# Patient Record
Sex: Female | Born: 1960
Health system: Southern US, Community
[De-identification: ages and names within clinical notes are randomized; demographics above are authoritative.]

## PROBLEM LIST (undated history)

## (undated) DIAGNOSIS — F32A Depression, unspecified: Secondary | ICD-10-CM

## (undated) DIAGNOSIS — I10 Essential (primary) hypertension: Secondary | ICD-10-CM

## (undated) DIAGNOSIS — F419 Anxiety disorder, unspecified: Secondary | ICD-10-CM

## (undated) DIAGNOSIS — E7800 Pure hypercholesterolemia, unspecified: Secondary | ICD-10-CM

## (undated) DIAGNOSIS — F329 Major depressive disorder, single episode, unspecified: Secondary | ICD-10-CM

## (undated) HISTORY — DX: Major depressive disorder, single episode, unspecified: F32.9

## (undated) HISTORY — PX: HEMORROIDECTOMY: SUR656

## (undated) HISTORY — PX: ADENOIDECTOMY: SUR15

## (undated) HISTORY — PX: ANKLE CLOSED REDUCTION: SHX880

## (undated) HISTORY — PX: DILATION AND CURETTAGE OF UTERUS: SHX78

## (undated) HISTORY — DX: Depression, unspecified: F32.A

## (undated) HISTORY — PX: ABDOMINAL HYSTERECTOMY: SHX81

---

## 2001-01-18 ENCOUNTER — Inpatient Hospital Stay (HOSPITAL_COMMUNITY): Admission: RE | Admit: 2001-01-18 | Discharge: 2001-01-20 | Payer: Self-pay | Admitting: Specialist

## 2001-11-21 ENCOUNTER — Encounter: Payer: Self-pay | Admitting: Pediatrics

## 2001-11-21 ENCOUNTER — Ambulatory Visit (HOSPITAL_COMMUNITY): Admission: RE | Admit: 2001-11-21 | Discharge: 2001-11-21 | Payer: Self-pay | Admitting: Pediatrics

## 2001-12-13 ENCOUNTER — Ambulatory Visit (HOSPITAL_COMMUNITY): Admission: RE | Admit: 2001-12-13 | Discharge: 2001-12-13 | Payer: Self-pay | Admitting: Internal Medicine

## 2001-12-19 ENCOUNTER — Encounter: Payer: Self-pay | Admitting: Internal Medicine

## 2001-12-19 ENCOUNTER — Ambulatory Visit (HOSPITAL_COMMUNITY): Admission: RE | Admit: 2001-12-19 | Discharge: 2001-12-19 | Payer: Self-pay | Admitting: Internal Medicine

## 2002-04-21 ENCOUNTER — Ambulatory Visit (HOSPITAL_COMMUNITY): Admission: RE | Admit: 2002-04-21 | Discharge: 2002-04-21 | Payer: Self-pay | Admitting: Internal Medicine

## 2002-04-21 ENCOUNTER — Encounter: Payer: Self-pay | Admitting: Internal Medicine

## 2002-11-25 ENCOUNTER — Ambulatory Visit (HOSPITAL_COMMUNITY): Admission: RE | Admit: 2002-11-25 | Discharge: 2002-11-25 | Payer: Self-pay | Admitting: Internal Medicine

## 2002-11-25 ENCOUNTER — Encounter: Payer: Self-pay | Admitting: Internal Medicine

## 2002-12-23 ENCOUNTER — Ambulatory Visit (HOSPITAL_COMMUNITY): Admission: RE | Admit: 2002-12-23 | Discharge: 2002-12-23 | Payer: Self-pay | Admitting: Internal Medicine

## 2002-12-23 ENCOUNTER — Encounter: Payer: Self-pay | Admitting: Internal Medicine

## 2003-06-26 ENCOUNTER — Ambulatory Visit (HOSPITAL_COMMUNITY): Admission: RE | Admit: 2003-06-26 | Discharge: 2003-06-26 | Payer: Self-pay | Admitting: Internal Medicine

## 2008-03-03 ENCOUNTER — Ambulatory Visit: Payer: Self-pay | Admitting: Gastroenterology

## 2008-03-23 ENCOUNTER — Ambulatory Visit: Payer: Self-pay | Admitting: Internal Medicine

## 2008-03-23 ENCOUNTER — Ambulatory Visit (HOSPITAL_COMMUNITY): Admission: RE | Admit: 2008-03-23 | Discharge: 2008-03-23 | Payer: Self-pay | Admitting: Internal Medicine

## 2008-09-04 ENCOUNTER — Encounter (INDEPENDENT_AMBULATORY_CARE_PROVIDER_SITE_OTHER): Payer: Self-pay | Admitting: General Surgery

## 2008-09-04 ENCOUNTER — Ambulatory Visit (HOSPITAL_COMMUNITY): Admission: RE | Admit: 2008-09-04 | Discharge: 2008-09-04 | Payer: Self-pay | Admitting: General Surgery

## 2010-08-17 LAB — BASIC METABOLIC PANEL WITH GFR
BUN: 16 mg/dL (ref 6–23)
CO2: 29 meq/L (ref 19–32)
Calcium: 9.9 mg/dL (ref 8.4–10.5)
Chloride: 102 meq/L (ref 96–112)
Creatinine, Ser: 0.6 mg/dL (ref 0.4–1.2)
GFR calc Af Amer: >60 mL/min (ref >60)
GFR calc non Af Amer: >60 mL/min (ref >60)
Glucose, Bld: 87 mg/dL (ref 70–99)
Potassium: 4 meq/L (ref 3.5–5.1)
Sodium: 137 meq/L (ref 135–145)

## 2010-08-17 LAB — CBC
HCT: 42 % (ref 36.0–46.0)
Hemoglobin: 14.6 g/dL (ref 12.0–15.0)
MCHC: 34.7 g/dL (ref 30.0–36.0)
MCV: 86 fL (ref 78.0–100.0)
Platelets: 229 K/uL (ref 150–400)
RBC: 4.89 MIL/uL (ref 3.87–5.11)
RDW: 13.1 % (ref 11.5–15.5)
WBC: 6 K/uL (ref 4.0–10.5)

## 2010-09-20 NOTE — Op Note (Signed)
NAME:  Allison [name], Allison [name]                 ACCOUNT NO.:  [id number]   MEDICAL RECORD NO.:  [id number]          PATIENT TYPE:  AMB   LOCATION:  DAY                           FACILITY:  [facility]   PHYSICIAN:  R. Roetta Sessions, M.D. DATE OF BIRTH:  1960/12/12   DATE OF PROCEDURE:  03/23/2008  DATE OF DISCHARGE:                               OPERATIVE REPORT   PROCEDURE:  Ileocolonoscopy diagnostic.   INDICATIONS FOR PROCEDURE:  A 50 year old lady with intermittent, small-  volume, painless hematochezia in setting of constipation, positive  family history of colon cancer.  [name] has never had her lower GI  tract evaluated.  She has a significant positive family history colon  cancer and a sister was diagnosed with this at age 49.  Colonoscopy is  now being done.  Risks, benefits, alternatives, and limitations have  been reviewed, questions answered.  She is agreeable.  Please see the  documentation of the medical record.   PROCEDURE NOTE:  O2 saturation, blood pressure, pulse, and respirations  were monitored throughout the entire procedure.   CONSCIOUS SEDATION:  Versed 4 mg IV, Demerol 75 mg IV in divided doses.   INSTRUMENT:  Pentax video chip system.   FINDINGS:  Digital rectal exam revealed multiple small external  hemorrhoidal tags, otherwise negative.   ENDOSCOPIC FINDINGS:  The prep was good.  Colon:  Colonic mucosa was  surveyed from the rectosigmoid junction through the left transverse  right colon, the appendiceal orifice, ileocecal valve, and cecum.  These  structures well seen and photographed for the record.  Terminal ileum  was intubated to 10 cm.  From this level, the scope was slowly  withdrawn.  All previously-mentioned mucosal surfaces were again seen.  The patient did have scattered left-sided diverticula; however, the  remainder of colonic mucosa and terminal ileal mucosa appeared entirely  normal.  Scope was pulled down the rectum.  A thorough examination of  the  rectal mucosa including retroflexed view of the anal verge  demonstrated no abnormalities.  The patient tolerated the procedure  well.  It was reactive endoscopy.   IMPRESSION:  1. External hemorrhoids, likely source of hematochezia, otherwise      normal rectum.  2. Left-sided diverticula, colonic mucosa, and terminal ileal mucosa      appeared normal.   RECOMMENDATIONS:  1. Constipation, diverticulosis, and hemorrhoid literature provided to      [name].  A 10-day course of Anusol HC      suppositories 1 per rectum at bedtime, daily fiber supplement      recommended.  2. MiraLax 17 g orally nightly p.r.n. constipation.  3. Given her family history of colon cancer, she ought to return in 5      years for high-risk screening colonoscopy.      Jonathon Bellows, M.D.  Electronically Signed     RMR/MEDQ  D:  03/23/2008  T:  03/24/2008  Job:  [id number]   cc:   Francoise Schaumann. Milford Cage DO, FAAP  Fax: [phone]

## 2010-09-20 NOTE — H&P (Signed)
NAME:  Allison, [name] NO.:  [id number]   MEDICAL RECORD NO.:  [id number]          PATIENT TYPE:  AMB   LOCATION:  DAY                           FACILITY:  [facility]   PHYSICIAN:  Dalia Heading, M.D.  DATE OF BIRTH:  [birth year]   DATE OF ADMISSION:  DATE OF DISCHARGE:  LH                              HISTORY & PHYSICAL   CHIEF COMPLAINT:  Hemorrhoidal disease.   HISTORY OF PRESENT ILLNESS:  The patient is a 50 year old white female  who is referred for evaluation and treatment of hemorrhoidal disease.  It has been present for many years.  She is having increasing discomfort  and occasional blood in the toilet paper when she wipes herself.  It is  difficult to keep herself clean.   PAST MEDICAL HISTORY:  Hypertension.   PAST SURGICAL HISTORY:  C-sections.   CURRENT MEDICATION:  Enalapril, citalopram, and estradiol.   ALLERGIES:  No known drug allergies.   REVIEW OF SYSTEMS:  Noncontributory.   PHYSICAL EXAMINATION:  GENERAL:  The patient is a well-developed and  well-nourished white female in no acute distress.  LUNGS:  Clear to auscultation with equal breath sounds bilaterally.  HEART:  Regular rate and rhythm without S3, S4, or murmurs.  RECTAL:  Extensive external hemorrhoidal disease with a posterior  prolapsing internal hemorrhoid.   IMPRESSION:  Hemorrhoidal disease.   PLAN:  The patient is scheduled for an extensive hemorrhoidectomy on  September 04, 2008.  The risks and benefits of the procedure including  bleeding, infection, and recurrence of the hemorrhoids were fully  explained to the patient, gave informed consent.      Dalia Heading, M.D.  Electronically Signed     MAJ/MEDQ  D:  08/13/2008  T:  08/14/2008  Job:  [id number]   cc:   Tilda Burrow, M.D.  Fax: [phone]   Francoise Schaumann. Raynelle Highland  Fax: [phone]   Short Stay, [facility]

## 2010-09-20 NOTE — Consult Note (Signed)
NAME:  Allison [name], Allison [name]                 ACCOUNT NO.:  [id number]   MEDICAL RECORD NO.:  [id number]          PATIENT TYPE:  AMB   LOCATION:  DAY                           FACILITY:  [facility]   PHYSICIAN:  R. Roetta Sessions, M.D. DATE OF BIRTH:  05-19-1960   DATE OF CONSULTATION:  03/03/2008  DATE OF DISCHARGE:                                 CONSULTATION   REASON FOR CONSULTATION:  Constipation, blood in the stool, and family  history of colon cancer, need colonoscopy.   PHYSICIAN REQUESTING CONSULTATION:  Francoise Schaumann. Halm, DO, FAAP   HISTORY OF PRESENT ILLNESS:  Allison [name] is a very pleasant 50 year old  lady who presents today for possible colonoscopy.  Allison [name] never had  colonoscopy done.  Her sister was diagnosed with colon cancer at age 71.  The patient has history of chronic constipation.  Generally, Allison [name] has a  bowel movement every day.  Allison [name] generally does not take anything for  bowel movements.  When her stools are hard, Allison [name] may have some bright red  blood per rectum.  Allison [name] states Allison [name] has hemorrhoids.  Allison [name] has been trying  to lose some weight, has lost 8 pounds.  Allison [name] denies any abdominal pain,  nausea or vomiting, heartburn, dysphagia, or odynophagia.   CURRENT MEDICATIONS:  1. Enalapril/hydrochlorothiazide 5/12.5 mg daily.  2. Citalopram 20 mg daily.  3. HRT daily.  4. Xanax p.r.n.  5. Calcium 500 mg daily.   ALLERGIES:  No known drug allergies.   PAST MEDICAL HISTORY:  Hypertension, anxiety, hypercholesterolemia,  trying diet therapy.  Prior EGD in August 2003 by Dr. Jena Gauss was normal.  Allison [name] had cesarean sections in 1983 and 1986.  Allison [name] had her tubes tied in  1987.  Allison [name] had another cesarean section in 1995.  Hysterectomy with  single ovary removed in 2000.  In 2001, Allison [name] had the other ovary removed.  Allison [name] had D&C in 2000.  Allison [name] has history of hepatic hemangioma.   FAMILY HISTORY:  Mother is 85, has history of Alzheimer's.  Father died  at age 71 with MI.  Allison [name] has a sister diagnosed with  colon cancer at age  65.  Allison [name] has a brother with diabetes.   SOCIAL HISTORY:  Allison [name] has been married for 29 years.  Allison [name] has 3 children.  Allison [name] works with [employer].  Allison [name] is a nonsmoker.  No  alcohol use.   REVIEW OF SYSTEMS:  GI:  See HPI.  CONSTITUTIONAL:  See HPI.  CARDIOPULMONARY:  No chest pain, shortness of breath, palpitations, or  cough.  GENITOURINARY:  No dysuria or hematuria.   PHYSICAL EXAMINATION:  VITAL SIGNS:  Weight 159, height 5 feet 1 inch,  temperature 97.9, blood pressure 130/90, and pulse 60.  GENERAL:  Pleasant, well-nourished, well-developed, Caucasian female in  no acute distress.  SKIN:  Warm and dry.  No jaundice.  HEENT:  Sclerae nonicteric. Oropharyngeal mucosa moist and pink.  No  lesions, erythema, or exudate.  No lymphadenopathy or thyromegaly.  CHEST:  Lungs are clear to auscultation.  CARDIAC:  Regular rate and rhythm.  Normal S1 and S2.  No murmurs, rubs,  or gallops.  ABDOMEN:  Positive bowel sounds.  Abdomen is soft, nontender, and  nondistended.  No organomegaly or masses.  No rebound or guarding.  No  abdominal bruits or hernias.  LOWER EXTREMITIES:  No edema.   IMPRESSION:  Allison [name] is a 49 year old lady with a family history of  colorectal cancer in her sister at age 6.  Allison [name] has chronic constipation  with intermittent hematochezia, which Allison [name] believes is related to her  hemorrhoids.  Allison [name] never had a colonoscopy.  We will recommend diagnostic  colonoscopy as well as high-risk screen, given family history of colon  cancer.   PLAN:  1. Colonoscopy with Dr. Jena Gauss in the near future.  I have discussed      risks, alternatives, and benefits with regards to, but not limited      to the risk reaction of medication, bleeding, infection,      perforation, and Allison [name] is agreeable to proceed.  2. Allison [name] may take MiraLax 17 g daily as needed for constipation.  3. Further recommendations to follow.      Tana Coast, P.AJonathon Bellows, M.D.  Electronically Signed    LL/MEDQ  D:  03/03/2008  T:  03/03/2008  Job:  [id number]   cc:   Francoise Schaumann. Milford Cage DO, FAAP  Fax: [phone]

## 2010-09-20 NOTE — Op Note (Signed)
NAME:  Allison [name], Allison [name] NO.:  [id number]   MEDICAL RECORD NO.:  [id number]          PATIENT TYPE:  AMB   LOCATION:  DAY                           FACILITY:  [facility]   PHYSICIAN:  Dalia Heading, M.D.  DATE OF BIRTH:  05/02/1961   DATE OF PROCEDURE:  09/04/2008  DATE OF DISCHARGE:                               OPERATIVE REPORT   PREOPERATIVE DIAGNOSIS:  Hemorrhoidal disease, bleeding.   POSTOPERATIVE DIAGNOSIS:  Hemorrhoidal disease, bleeding.   PROCEDURE:  Extensive hemorrhoidectomy.   SURGEON:  Dalia Heading, MD   ANESTHESIA:  General.   INDICATIONS:  The patient is a 50 year old white female who is referred  for evaluation and treatment of hemorrhoidal disease.  The risks and  benefits of the procedure including bleeding, infection, and recurrence  of the hemorrhoidal disease were fully explained to the patient, gave  informed consent.   PROCEDURE NOTE:  The patient was placed in the lithotomy position after  general anesthesia was administered.  The perineum was prepped and  draped using the usual sterile technique with Betadine.  Surgical site  confirmation was performed.   The patient had significant internal and external hemorrhoidal disease  circumferentially.  The external hemorrhoidal disease was most  pronounced along the right side of the anus.  Multiple external  hemorrhoidal skin as well as internal hemorrhoids were excised using the  LigaSure.  Care was taken to avoid the external sphincter mechanism.  Sensorcaine 0.5% was instilled into the surrounding perineum.  Surgicel  and viscous Xylocaine packing was then placed.   All tape and needle counts were correct at the end of the procedure.  The patient was awakened and transferred to PACU in stable condition.   COMPLICATIONS:  None.   SPECIMEN:  Hemorrhoids.   ESTIMATED BLOOD LOSS:  Minimal.      Dalia Heading, M.D.  Electronically Signed     MAJ/MEDQ  D:  09/04/2008  T:   09/04/2008  Job:  [id number]   cc:   Tilda Burrow, M.D.  Fax: [phone]   Francoise Schaumann. Milford Cage DO, FAAP  Fax: [phone]

## 2010-09-23 NOTE — Consult Note (Signed)
NAME:  Allison [name], Allison [name]                           ACCOUNT NO.:  [id number]   MEDICAL RECORD NO.:  [id number]                   PATIENT TYPE:  AMB   LOCATION:  DAY                                  FACILITY:  [facility]   PHYSICIAN:  Gerrit Friends. Rourk, M.D.               DATE OF BIRTH:  [birth year]   DATE OF CONSULTATION:  DATE OF DISCHARGE:                           GASTROENTEROLOGY CONSULTATION   REASON FOR CONSULTATION:  Epigastric pain and indigestion.   HISTORY OF PRESENT ILLNESS:  The patient is a pleasant 50 year-old lady sent  over at the request of Dr. Acey Lav to further evaluate an approximately  six week history of epigastric lower retrosternal chest discomfort. She  tells me the symptoms started acutely one day when she stretched.  She  tells me ever since that she has had intermittent epigastric and lower  retrosternal chest discomfort oftentimes after she eats.  She was started on  Nexium 40 mg early daily and Reglan 5 mg early b.i.d.  Several weeks ago she  had seen Dr. Milford Cage multiple times.  She feels that her symptoms are slowly  improving but are still present and she reports multiple episodes weekly.  Again, food tends to incite symptoms.  They last for a few hours and  subside. She has not had any nausea or vomiting.  No odynophagia or  dysphagia.  She is really not having much in the way typical symptoms.  She  describes lower retrosternal chest discomfort and epigastric discomfort as a  boring pain. It does not radiate.  An ultrasound recently demonstrated a  normal gallbladder and multiple lesions in the liver consistent with hepatic  cysts.  It was felt that a followup examination in an interval time was  indicated.  She has not had any change in her bowel habits.  No melena or  rectal bleeding. No constipation or diarrhea.  She has not had any change in  weight.  She does use Aleve rarely for various aches and pains.   PAST MEDICAL HISTORY:  No chronic illnesses such  as diabetes, heart or lung  problems.   PAST SURGICAL HISTORY:  1. Cesarean section times three.  2. Tubal ligation.  3. Partial followed by a complete hysterectomy (Dr. Kate Sable).   CURRENT MEDICATIONS:  1. Nexium 40 mg early daily.  2. Reglan 5 mg early b.i.d.   ALLERGIES:  No known drug allergies.   FAMILY HISTORY:  Negative for chronic GI or liver disease.  Father died of  an MI.  Mother is alive and in good health.   SOCIAL HISTORY:  The patient has been married for 21 years and has three  children.  She is employed part-time with [employer] here in  [place].  No tobacco or alcohol.   REVIEW OF SYMPTOMS:  CARDIAC:  No chest pain or dyspnea.  CONSTITUTIONAL:  No fever or chills.  PHYSICAL EXAMINATION:  GENERAL:  This reveals a pleasant 50 year-old lady resting comfortably.   VITAL SIGNS:  Weight 147, height 5'1, temperature 98.8, blood pressure  110/82, pulse 78.   SKIN:  Warm and dry.  No jaundice.  No stigmata of chronic liver disease.   HEENT:  No scleral icterus.  Conjunctivae are pink.  Oral cavity:  No  lesions. Jugular venous distention is not prominent.   CHEST:  Lungs are clear to auscultation.   CARDIAC:  Regular rate and rhythm without murmur, gallop or rub.   BREAST:  Deferred.   ABDOMEN:  Somewhat obese, positive bowel sounds, soft with some epigastric  tenderness to palpation.  No appreciable mass or organomegaly.   EXTREMITIES:  No cyanosis, clubbing or edema.   IMPRESSION:  The patient is a pleasant 50 year-old lady with a six week  history of intermittent epigastric and lower retrosternal chest discomfort.  She does have a postprandial component.  Ultrasound demonstrated no evidence  of gallbladder disease.  Symptoms have improved, but have been somewhat slow  to respond at least temporarily related to Nexium and Reglan.  Symptoms  would be somewhat atypical for peptic ulcer disease.  Musculoskeletal strain  related to  stretching also remains in the differential, but it would be  hard to explain postprandial exacerbation of musculoskeletal pain.  Hepatic  cysts most likely have nothing to do with her presentation, but I agree with  Dr. Purcell Mouton that she ought to have a followup ultrasound.   RECOMMENDATIONS:  1. I have offered the patient an upper gastrointestinal endoscopy to further     evaluate her symptoms.  The potential risks, benefits and alternatives     have been explained and her questions have been answered.  The patient is     at very low risk for conscious sedation using Versed and Demerol.  We     will also go ahead and check a liver profile, amylase and lipase today.  2. I have recommended that she come back in three months for a followup     hepatic ultrasound.  3. She may ultimately need a HIDA scan, but we will decide that based on     endoscopic findings, results of laboratory work and how she does over the     next one to two weeks.   I would like to thank Dr. Acey Lav for allowing me to see this nice lady  today.  Further recommendations to follow.                                               Gerrit Friends. Rourk, M.D.    RMR/MEDQ  D:  12/10/2001  T:  12/10/2001  Job:  [id number]

## 2010-09-23 NOTE — Op Note (Signed)
   NAME:  Allison [name], Allison [name]                           ACCOUNT NO.:  [id number]   MEDICAL RECORD NO.:  [id number]                   PATIENT TYPE:  AMB   LOCATION:  DAY                                  FACILITY:  [facility]   PHYSICIAN:  Gerrit Friends. Rourk, M.D.               DATE OF BIRTH:  1960-06-18   DATE OF PROCEDURE:  DATE OF DISCHARGE:  12/13/2001                                 OPERATIVE REPORT   PROCEDURE:  Diagnostic esophagogastroduodenoscopy.   INDICATIONS FOR PROCEDURE:  The patient is a 50 year old lady with recent  epigastric and lower external chest pain. EGD is now being done to further  evaluate her symptoms. She has responded somewhat to acid suppressive  therapy and prokinetic therapy. EGD is now being done to further evaluate  her symptoms. This lady underwent an ultrasound of the abdomen on November 21, 2001. She was found to have multiple echogenic lesions in the liver most  consistent with cysts. The gallbladder appeared normal. There was no biliary  dilation. EGD has been discussed with [name]. The potential risks,  benefits, and alternatives have been reviewed, questions answered. She is  agreeable. Please see my dictated consultation note for more information.   MONITORING:  O2 saturation, blood pressure, pulse, and respirations were  monitored throughout the entire procedure.   CONSCIOUS SEDATION:  Versed 3 mg IV, Demerol 50 mg IV in divided doses.   INSTRUMENTS:  Olympus video chip adult gastroscope.   FINDINGS:  Examination of the tubular esophagus revealed no mucosal  abnormalities. The EG junction was easily traversed.   STOMACH:  The gastric cavity was empty and insufflated well with air.  Through examination of the gastric mucosa including a retroflexed view of  the proximal stomach and esophagogastric junction demonstrated no  abnormality. The pylorus was patent and easily traversed.   DUODENUM:  The bulb and second portion appeared normal.   THERAPEUTIC/DIAGNOSTIC MANEUVERS:  None.   The patient tolerated the procedure well and was reacted in endoscopy.   IMPRESSION:  Normal esophagus, normal stomach. Normal D1, D2. No endoscopic  explanation for the patient's symptoms. I doubt the lesions seen in the  liver have anything to do with her symptoms. Of note, her liver profile,  amylase and lipase were all normal through my office on December 10, 2001.    RECOMMENDATIONS:  1. Will proceed with a HIDA with a fatty meal challenge.  2. Further recommendations to follow.                                               Gerrit Friends. Rourk, M.D.    RMR/MEDQ  D:  12/13/2001  T:  12/17/2001  Job:  [id number]   cc:   Francoise Schaumann. Halm, D.O.

## 2010-09-23 NOTE — Discharge Summary (Signed)
[facility]  Patient:    Allison [name], MAUND Visit Number: [id number] MRN: [id number]          Service Type: Attending:  Belia Heman. Kate Sable, M.D. Dictated by:   Belia Heman. Kate Sable, M.D. Adm. Date:  01/17/01 Disc. Date: 01/20/01   CC:         Barbaraann Barthel, M.D.   Discharge Summary  HISTORY:  This 50 year old married white female gravida 3, para was admitted for surgery for a left pelvic mass.  The details of the patients history and physical examination are documented in the typed admission note.  OPERATIVE PROCEDURES:  January 17, 2001:  Exploratory laparotomy, lysis of extensive adhesions, excision of left pelvic mass, incidental appendectomy. At surgery exceedingly dense adhesions were noted which obscured a cystic left pelvic mass which was intimately involved with the large bowel.  Dr. Barbaraann Barthel was asked to step into the operating room for a surgical consultation and with his help the adhesions were lysed and the mass isolated.  The mass was then excised and sent for frozen section which returned a benign diagnosis.  Incidental appendectomy was also carried out.  Details of the operative procedure are documented in the typed operative note of both Dr. Kate Sable and Dr. Malvin Johns.  PATHOLOGY:  Cystic mass left adnexa, benign ovarian cyst, fallopian tube segment, left ovarian tissue, benign fragments of fallopian tube, and evidence of salpingitis isthmica nodosa, appendix, incidental appendectomy.  HOSPITAL COURSE:  The patients postoperative course was afebrile and uneventful.  The patient had normal resumption of bowel and bladder function and showed evidence of good wound healing.  First postoperative day hematocrit was 36%.  By the second postoperative day the patient was tolerating a regular diet and passing flatus.  Patient was ambulating well and appeared comfortable.  Skin incision appeared clean and dry.  The abdomen was soft  and nontender with no evidence of distention.  The patient was discharged home in satisfactory condition with postoperative instructions.  FINAL DIAGNOSES:  Left ovarian cyst with dense pelvic adhesions, chronic salpingo-oophoritis.  DISPOSITION:  Discharged home.  Office followup for skin staple removal and postoperative care.  Lortab 5/500 #15. Dictated by:   Belia Heman. Kate Sable, M.D. Attending:  Belia Heman. Kate Sable, M.D. DD:  02/21/01 TD:  02/22/01 Job: [id number]

## 2010-12-07 ENCOUNTER — Other Ambulatory Visit: Payer: Self-pay | Admitting: Obstetrics and Gynecology

## 2010-12-07 ENCOUNTER — Other Ambulatory Visit (HOSPITAL_COMMUNITY)
Admission: RE | Admit: 2010-12-07 | Discharge: 2010-12-07 | Disposition: A | Discharge status: Home / Self Care | Payer: BC Managed Care – PPO | Source: Ambulatory Visit | Attending: Obstetrics and Gynecology | Admitting: Obstetrics and Gynecology

## 2010-12-07 DIAGNOSIS — Z01419 Encounter for gynecological examination (general) (routine) without abnormal findings: Secondary | ICD-10-CM | POA: Insufficient documentation

## 2011-07-05 ENCOUNTER — Other Ambulatory Visit: Payer: Self-pay | Admitting: Obstetrics and Gynecology

## 2012-07-24 ENCOUNTER — Other Ambulatory Visit: Payer: Self-pay | Admitting: Obstetrics and Gynecology

## 2012-07-24 MED ORDER — ESTRADIOL 2 MG PO TABS: 2.0000 mg | ORAL_TABLET | Freq: Every day | ORAL | Status: DC

## 2013-03-07 ENCOUNTER — Encounter: Payer: Self-pay | Admitting: Internal Medicine

## 2013-03-25 ENCOUNTER — Telehealth: Payer: Self-pay

## 2013-03-25 ENCOUNTER — Telehealth: Payer: Self-pay | Admitting: General Practice

## 2013-03-25 ENCOUNTER — Other Ambulatory Visit: Payer: Self-pay

## 2013-03-25 DIAGNOSIS — Z1211 Encounter for screening for malignant neoplasm of colon: Secondary | ICD-10-CM

## 2013-03-25 NOTE — Telephone Encounter (Signed)
Patient received a letter to schedule her 5 year repeat tcs.  She would like for you to call her after 3pm.  Routing to [facility] for triage

## 2013-03-27 NOTE — Telephone Encounter (Signed)
Gastroenterology Pre-Procedure Review  Request Date:03/25/2013 Requesting Physician: Pt received a letter that it was time for her next colonoscopy Last one 03/23/2008 by RMR/  repeat in 5 years/ FH of CC  ( sister diagnosed in early 44"s)  PATIENT REVIEW QUESTIONS: The patient responded to the following health history questions as indicated:    1. Diabetes Melitis: no 2. Joint replacements in the past 12 months: no 3. Major health problems in the past 3 months: no 4. Has an artificial valve or MVP: no 5. Has a defibrillator: no 6. Has been advised in past to take antibiotics in advance of a procedure like teeth cleaning: no    MEDICATIONS & ALLERGIES:    Patient reports the following regarding taking any blood thinners:   Plavix? no Aspirin? no Coumadin? no  Patient confirms/reports the following medications:  Current Outpatient Prescriptions  Medication Sig Dispense Refill  . ALPRAZolam (XANAX) 1 MG tablet Take 1 mg by mouth at bedtime as needed for anxiety. Usually takes 1/2 tablet as needed      . citalopram (CELEXA) 20 MG tablet Take 20 mg by mouth daily.      . Enalapril-Hydrochlorothiazide 5-12.5 MG per tablet Take 1 tablet by mouth daily.      Marland Kitchen estradiol (ESTRACE) 2 MG tablet Take 1 tablet (2 mg total) by mouth daily.  90 tablet  2   No current facility-administered medications for this visit.    Patient confirms/reports the following allergies:  No Known Allergies  No orders of the defined types were placed in this encounter.    AUTHORIZATION INFORMATION Primary Insurance:   ID #:   Group #:  Pre-Cert / Auth required:  Pre-Cert / Auth #:   Secondary Insurance:   ID #:   Group #:  Pre-Cert / Auth required:  Pre-Cert / Auth #:   SCHEDULE INFORMATION: Procedure has been scheduled as follows:  Date: 04/14/2013             Time: 2:15 PM  Location: [facility] Short Stay  This Gastroenterology Pre-Precedure Review Form is being routed to the following  provider(s): R. Roetta Sessions, MD

## 2013-03-27 NOTE — Telephone Encounter (Signed)
Ok to schedule.

## 2013-03-27 NOTE — Telephone Encounter (Signed)
See separate triage.  

## 2013-04-01 MED ORDER — PEG-KCL-NACL-NASULF-NA ASC-C 100 G PO SOLR: 1.0000 | ORAL | Status: DC

## 2013-04-01 NOTE — Telephone Encounter (Signed)
Rx sent to the pharmacy and instructions mailed to pt.  

## 2013-04-02 ENCOUNTER — Encounter (HOSPITAL_COMMUNITY): Payer: Self-pay

## 2013-04-14 ENCOUNTER — Encounter (HOSPITAL_COMMUNITY)
Admission: RE | Disposition: A | Discharge status: Home / Self Care | Payer: Self-pay | Source: Ambulatory Visit | Attending: Internal Medicine

## 2013-04-14 ENCOUNTER — Encounter (HOSPITAL_COMMUNITY): Payer: Self-pay | Admitting: *Deleted

## 2013-04-14 ENCOUNTER — Ambulatory Visit (HOSPITAL_COMMUNITY)
Admission: RE | Admit: 2013-04-14 | Discharge: 2013-04-14 | Disposition: A | Discharge status: Home / Self Care | Payer: BC Managed Care – PPO | Source: Ambulatory Visit | Attending: Internal Medicine | Admitting: Internal Medicine

## 2013-04-14 DIAGNOSIS — Z1211 Encounter for screening for malignant neoplasm of colon: Secondary | ICD-10-CM | POA: Insufficient documentation

## 2013-04-14 DIAGNOSIS — I10 Essential (primary) hypertension: Secondary | ICD-10-CM | POA: Insufficient documentation

## 2013-04-14 DIAGNOSIS — K5730 Diverticulosis of large intestine without perforation or abscess without bleeding: Secondary | ICD-10-CM

## 2013-04-14 DIAGNOSIS — Z800 Family history of malignant neoplasm of digestive organs: Secondary | ICD-10-CM | POA: Insufficient documentation

## 2013-04-14 HISTORY — DX: Essential (primary) hypertension: I10

## 2013-04-14 HISTORY — DX: Pure hypercholesterolemia, unspecified: E78.00

## 2013-04-14 HISTORY — PX: COLONOSCOPY: SHX5424

## 2013-04-14 SURGERY — COLONOSCOPY
Anesthesia: Moderate Sedation

## 2013-04-14 MED ORDER — MEPERIDINE HCL 100 MG/ML IJ SOLN
INTRAMUSCULAR | Status: DC | PRN
  Administered 2013-04-14: 25 mg via INTRAVENOUS
  Administered 2013-04-14: 50 mg via INTRAVENOUS
  Administered 2013-04-14: 25 mg via INTRAVENOUS

## 2013-04-14 MED ORDER — MIDAZOLAM HCL 5 MG/5ML IJ SOLN
INTRAMUSCULAR | Status: AC
  Filled 2013-04-14: qty 10

## 2013-04-14 MED ORDER — ONDANSETRON HCL 4 MG/2ML IJ SOLN
INTRAMUSCULAR | Status: DC | PRN
  Administered 2013-04-14: 4 mg via INTRAVENOUS

## 2013-04-14 MED ORDER — MIDAZOLAM HCL 5 MG/5ML IJ SOLN
INTRAMUSCULAR | Status: DC | PRN
  Administered 2013-04-14: 1 mg via INTRAVENOUS
  Administered 2013-04-14 (×2): 2 mg via INTRAVENOUS
  Administered 2013-04-14: 1 mg via INTRAVENOUS

## 2013-04-14 MED ORDER — SIMETHICONE 40 MG/0.6ML PO SUSP
ORAL | Status: DC | PRN
  Administered 2013-04-14: 11:00:00

## 2013-04-14 MED ORDER — SODIUM CHLORIDE 0.9 % IV SOLN
INTRAVENOUS | Status: DC
  Administered 2013-04-14: 11:00:00 via INTRAVENOUS

## 2013-04-14 MED ORDER — ONDANSETRON HCL 4 MG/2ML IJ SOLN
INTRAMUSCULAR | Status: AC
  Filled 2013-04-14: qty 2

## 2013-04-14 MED ORDER — MEPERIDINE HCL 100 MG/ML IJ SOLN
INTRAMUSCULAR | Status: AC
  Filled 2013-04-14: qty 2

## 2013-04-14 NOTE — H&P (Signed)
  Primary Care Physician:  Catalina Pizza, MD Primary Gastroenterologist:  Dr. Jena Gauss  Pre-Procedure History & Physical: HPI:  Allison [name] is a 52 y.o. female is here for a screening colonoscopy. High risk screening examination. Sister with colon cancer at relatively young age. Patient currently has no GI symptoms. Negative colonoscopy 5 years ago.  Past Medical History  Diagnosis Date  . Hypertension   . Hypercholesteremia     Past Surgical History  Procedure Laterality Date  . Abdominal hysterectomy    . Dilation and curettage of uterus    . Cesarean section      X 3  . Adenoidectomy      Prior to Admission medications   Medication Sig Start Date End Date Taking? Authorizing Provider  citalopram (CELEXA) 20 MG tablet Take 20 mg by mouth daily.   Yes Historical Provider, MD  Enalapril-Hydrochlorothiazide 5-12.5 MG per tablet Take 1 tablet by mouth daily.   Yes Historical Provider, MD  estradiol (ESTRACE) 2 MG tablet Take 1 tablet (2 mg total) by mouth daily. 07/24/12  Yes Tilda Burrow, MD  ALPRAZolam Prudy Feeler) 1 MG tablet Take 1 mg by mouth daily as needed for anxiety.     Historical Provider, MD    Allergies as of 03/25/2013  . (Not on File)    Family History  Problem Relation Age of Onset  . Colon cancer Sister     History   Social History  . Marital Status: Married    Spouse Name: N/A    Number of Children: N/A  . Years of Education: N/A   Occupational History  . Not on file.   Social History Main Topics  . Smoking status: Never Smoker   . Smokeless tobacco: Not on file  . Alcohol Use: No  . Drug Use: No  . Sexual Activity: Not on file   Other Topics Concern  . Not on file   Social History Narrative  . No narrative on file    Review of Systems: See HPI, otherwise negative ROS  Physical Exam: BP 128/71  Pulse 61  Temp(Src) 98.1 F (36.7 C) (Oral)  Resp 18  Ht 5\' 1"  (1.549 m)  Wt 177 lb (80.287 kg)  BMI 33.46 kg/m2  SpO2 97% General:   Alert,   Well-developed, well-nourished, pleasant and cooperative in NAD Head:  Normocephalic and atraumatic. Eyes:  Sclera clear, no icterus.   Conjunctiva pink. Ears:  Normal auditory acuity. Nose:  No deformity, discharge,  or lesions. Mouth:  No deformity or lesions, dentition normal. Neck:  Supple; no masses or thyromegaly. Lungs:  Clear throughout to auscultation.   No wheezes, crackles, or rhonchi. No acute distress. Heart:  Regular rate and rhythm; no murmurs, clicks, rubs,  or gallops. Abdomen:  Soft, nontender and nondistended. No masses, hepatosplenomegaly or hernias noted. Normal bowel sounds, without guarding, and without rebound.   Msk:  Symmetrical without gross deformities. Normal posture. Pulses:  Normal pulses noted. Extremities:  Without clubbing or edema. Neurologic:  Alert and  oriented x4;  grossly normal neurologically. Skin:  Intact without significant lesions or rashes. Cervical Nodes:  No significant cervical adenopathy. Psych:  Alert and cooperative. Normal mood and affect.  Impression/Plan: Allison [name] is now here to undergo a screening colonoscopy. High risk examination.  Risks, benefits, limitations, imponderables and alternatives regarding colonoscopy have been reviewed with the patient. Questions have been answered. All parties agreeable.

## 2013-04-14 NOTE — Op Note (Signed)
[facility] [address] [place], [address]   COLONOSCOPY PROCEDURE REPORT  PATIENT: Allison, [name]  MR#:         [id number] BIRTHDATE: Dec 28, 1960 , 52  yrs. old GENDER: Female ENDOSCOPIST: R.  Roetta Sessions, MD FACP FACG REFERRED BY:  Catalina Pizza, M.D. PROCEDURE DATE:  04/14/2013 PROCEDURE:     Screening ileo-colonoscopy  INDICATIONS: High-risk screening examination- sister with colorectal cancer at a young age.  INFORMED CONSENT:  The risks, benefits, alternatives and imponderables including but not limited to bleeding, perforation as well as the possibility of a missed lesion have been reviewed.  The potential for biopsy, lesion removal, etc. have also been discussed.  Questions have been answered.  All parties agreeable. Please see the history and physical in the medical record for more information.  MEDICATIONS: Versed 6 mg IV and Demerol 100 mg IV in divided doses. Zofran 4 mg IV.  DESCRIPTION OF PROCEDURE:  After a digital rectal exam was performed, the EC-[id number] ([id number])  colonoscope was advanced from the anus through the rectum and colon to the area of the cecum, ileocecal valve and appendiceal orifice.  The cecum was deeply intubated.  These structures were well-seen and photographed for the record.  From the level of the cecum and ileocecal valve, the scope was slowly and cautiously withdrawn.  The mucosal surfaces were carefully surveyed utilizing scope tip deflection to facilitate fold flattening as needed.  The scope was pulled down into the rectum where a thorough examination including retroflexion was performed.    FINDINGS:  Adequate preparation Normal rectum. Few, scattered sigmoid diverticula; the remainder of the colonic mucosa appeared normal. The distal 10 cm of terminal ileal mucosa also appeared normal.  THERAPEUTIC / DIAGNOSTIC MANEUVERS PERFORMED:  None  COMPLICATIONS: None  CECAL WITHDRAWAL TIME:  8 minutes  IMPRESSION:   Colonic diverticulosis  RECOMMENDATIONS: Repeat colonoscopy in 5 years for screening purposes.   _______________________________ eSigned:  R. Roetta Sessions, MD FACP [facility] 04/14/2013 11:45 AM   CC:    PATIENT NAME:  Allison, [name] MR#: [id number]

## 2013-04-17 ENCOUNTER — Encounter (HOSPITAL_COMMUNITY): Payer: Self-pay | Admitting: Internal Medicine

## 2013-06-02 ENCOUNTER — Other Ambulatory Visit: Payer: Self-pay | Admitting: Obstetrics and Gynecology

## 2013-07-25 ENCOUNTER — Telehealth: Payer: Self-pay

## 2013-07-25 NOTE — Telephone Encounter (Signed)
Pt states would like to discuss concerns about estradiol with Dr. Emelda FearFerguson, asked for more info but pt states would like to discuss with Dr. Emelda FearFerguson.

## 2013-08-19 NOTE — Telephone Encounter (Signed)
i left a msg for pt to let us know a callback time for later this wk.

## 2013-12-22 ENCOUNTER — Encounter: Payer: Self-pay | Admitting: Obstetrics and Gynecology

## 2014-08-10 ENCOUNTER — Other Ambulatory Visit: Payer: Self-pay | Admitting: Obstetrics and Gynecology

## 2015-03-03 ENCOUNTER — Encounter: Payer: Self-pay | Admitting: Obstetrics and Gynecology

## 2015-03-03 ENCOUNTER — Ambulatory Visit (INDEPENDENT_AMBULATORY_CARE_PROVIDER_SITE_OTHER): Payer: BLUE CROSS/BLUE SHIELD | Admitting: Obstetrics and Gynecology

## 2015-03-03 VITALS — BP 138/92 | HR 60 | Ht 61.0 in | Wt 181.6 lb

## 2015-03-03 DIAGNOSIS — Z01419 Encounter for gynecological examination (general) (routine) without abnormal findings: Secondary | ICD-10-CM | POA: Diagnosis not present

## 2015-03-03 DIAGNOSIS — Z0000 Encounter for general adult medical examination without abnormal findings: Secondary | ICD-10-CM

## 2015-03-03 NOTE — Progress Notes (Signed)
Patient ID: Allison [name], female   DOB: [birth year], 53 y.o.   MRN: [id number]  Assessment:  Annual Gyn Exam   Plan:  1.   Annual exam for support useful every three years   2.   Annual mammogram advised 3.   Dr. Margo Aye give hemoccult cards during years with no gynecological visit.  4.   Refill Estrace  Subjective:  Allison [name] is a 54 y.o. female No obstetric history on file. who presents for annual exam. No LMP recorded. Patient has had a hysterectomy. The patient reports she has no complaints today. Pt reports her last pap smear was 4 years ago. Pt reports she had labs including lipid panel completed last week at her PCPs direction.   The following portions of the patient's history were reviewed and updated as appropriate: allergies, current medications, past family history, past medical history, past social history, past surgical history and problem list. Past Medical History  Diagnosis Date  . Hypertension   . Hypercholesteremia    Past Surgical History  Procedure Laterality Date  . Abdominal hysterectomy    . Dilation and curettage of uterus    . Cesarean section      X 3  . Adenoidectomy    . Colonoscopy N/A 04/14/2013    Procedure: COLONOSCOPY;  Surgeon: Corbin Ade, MD;  [facility]: [facility];  Service: Endoscopy;  Laterality: N/A;  2:15 PM-moved to 1100 [address] notified pt     Current outpatient prescriptions:  .  ALPRAZolam (XANAX) 1 MG tablet, Take 1 mg by mouth daily as needed for anxiety. , Disp: , Rfl:  .  citalopram (CELEXA) 20 MG tablet, Take 20 mg by mouth daily., Disp: , Rfl:  .  Enalapril-Hydrochlorothiazide 5-12.5 MG per tablet, Take 1 tablet by mouth daily., Disp: , Rfl:  .  estradiol (ESTRACE) 2 MG tablet, TAKE 1 TABLET (2 MG TOTAL) BY MOUTH DAILY., Disp: 90 tablet, Rfl: 13  Review of Systems Constitutional: negative Gastrointestinal: negative Genitourinary: negative   Objective:  BP 138/92 mmHg  Pulse 60  Ht  (1.549 m)  Wt 181 lb 9.6  oz (82.373 kg)  BMI 34.33 kg/m2   BMI: Body mass index is 34.33 kg/(m^2).  General Appearance: Alert, appropriate appearance for age. No acute distress HEENT: Grossly normal Neck / Thyroid:  Cardiovascular: RRR; normal S1, S2, no murmur Lungs: CTA bilaterally Back: No CVAT Breast Exam: No dimpling, nipple retraction or discharge. No masses or nodes., Normal to inspection, Normal breast tissue bilaterally and No masses or nodes.No dimpling, nipple retraction or discharge. Gastrointestinal: Soft, non-tender, no masses or organomegaly Pelvic Exam: Vulva and vagina appear normal. Bimanual exam reveals normal uterus and adnexa. External genitalia: normal general appearance Urinary system: urethral meatus normal Vaginal: normal mucosa without prolapse or lesions, normal without tenderness, induration or masses and normal rugae Cervix: removed surgically Adnexa: removed surgically and and negative Uterus: removed surgically Rectovaginal: normal rectal, no masses Lymphatic Exam: Non-palpable nodes in neck, clavicular, axillary, or inguinal regions Skin: no rash or abnormalities Neurologic: Normal gait and speech, no tremor  Psychiatric: Alert and oriented, appropriate affect.  Urinalysis:Not done  Christin Bach. MD Pgr [phone] 9:58 AM  By signing my name below, I, Marica Otter, attest that this documentation has been prepared under the direction and in the presence of Christin Bach, MD. Electronically Signed: Marica Otter, ED Scribe. 03/03/2015. 9:58 AM.  I personally performed the services described in this documentation, which was SCRIBED in my presence.  The recorded information has been reviewed and considered accurate. It has been edited as necessary during review. Tilda BurrowFERGUSON,Ascencion Stegner V, MD

## 2015-03-29 ENCOUNTER — Encounter: Payer: Self-pay | Admitting: Obstetrics and Gynecology

## 2015-09-06 ENCOUNTER — Other Ambulatory Visit: Payer: Self-pay | Admitting: Obstetrics and Gynecology

## 2015-09-08 NOTE — Telephone Encounter (Signed)
refil x 1 month, needs appt.

## 2015-12-10 ENCOUNTER — Other Ambulatory Visit: Payer: Self-pay | Admitting: Obstetrics and Gynecology

## 2015-12-20 ENCOUNTER — Other Ambulatory Visit: Payer: Self-pay | Admitting: Obstetrics and Gynecology

## 2015-12-20 MED ORDER — ESTRADIOL 2 MG PO TABS: ORAL_TABLET | ORAL | 0 refills | Status: DC

## 2016-03-20 ENCOUNTER — Other Ambulatory Visit: Payer: Self-pay | Admitting: Obstetrics and Gynecology

## 2016-09-04 ENCOUNTER — Other Ambulatory Visit: Payer: Self-pay | Admitting: Obstetrics and Gynecology

## 2016-09-05 ENCOUNTER — Telehealth: Payer: Self-pay | Admitting: *Deleted

## 2016-09-05 ENCOUNTER — Other Ambulatory Visit: Payer: Self-pay | Admitting: Obstetrics and Gynecology

## 2016-09-08 ENCOUNTER — Encounter: Payer: Self-pay | Admitting: Obstetrics and Gynecology

## 2016-09-25 ENCOUNTER — Telehealth: Payer: Self-pay | Admitting: *Deleted

## 2016-09-25 NOTE — Telephone Encounter (Signed)
LMOM that pt should be seen by Dr Emelda FearFerguson before refill since it has been over a year. Advised pt that I did see where she had one refill at [facility]. Advised her to call us back if that wasn't the case.

## 2016-10-11 ENCOUNTER — Encounter: Payer: Self-pay | Admitting: Obstetrics and Gynecology

## 2016-10-11 ENCOUNTER — Ambulatory Visit (INDEPENDENT_AMBULATORY_CARE_PROVIDER_SITE_OTHER): Payer: BLUE CROSS/BLUE SHIELD | Admitting: Obstetrics and Gynecology

## 2016-10-11 ENCOUNTER — Other Ambulatory Visit: Payer: Self-pay | Admitting: Obstetrics and Gynecology

## 2016-10-11 VITALS — BP 120/62 | HR 56 | Ht 61.0 in | Wt 167.0 lb

## 2016-10-11 DIAGNOSIS — Z90710 Acquired absence of both cervix and uterus: Secondary | ICD-10-CM

## 2016-10-11 DIAGNOSIS — E8940 Asymptomatic postprocedural ovarian failure: Secondary | ICD-10-CM | POA: Insufficient documentation

## 2016-10-11 MED ORDER — ESTRADIOL 1 MG PO TABS: 1.0000 mg | ORAL_TABLET | Freq: Every day | ORAL | 11 refills | Status: DC

## 2016-10-11 NOTE — Progress Notes (Signed)
Patient ID: Allison IshiharaVickey C Lanza, female   DOB: 1960-11-27, 56 y.o.   MRN: [id number]   [facility]  @DATE @            Patient name: Allison [name] MRN [id number]  Date of birth: 1960-11-27  CC & HPI:   Chief Complaint  Patient presents with  . Follow-up    needing refill of Estradiol     Allison [name] is a 56 y.o. female presenting today for refill of 2 mg Estradiol, which she states controls her vasomotor symptoms.  Pt did well with less vasomotor sx when she missed pills recently. Pt advised to reduce Estrogen dosing level , as few need 2 mg.d  ROS:  ROS No complaints , Was placed on 2mg  at time of surgical oophorectojmy  Pertinent History Reviewed:   Reviewed: Significant for abdominal hysterectomy, c/s x3, D&C, negative hx for clots, cardiac probs, DVT, PE,  Medical         Past Medical History:  Diagnosis Date  . Hypercholesteremia   . Hypertension                               Surgical Hx:    Past Surgical History:  Procedure Laterality Date  . ABDOMINAL HYSTERECTOMY    . ADENOIDECTOMY    . CESAREAN SECTION     X 3  . COLONOSCOPY N/A 04/14/2013   Procedure: COLONOSCOPY;  Surgeon: Corbin Adeobert M Rourk, MD;  Location: [facility];  Service: Endoscopy;  Laterality: N/A;  2:15 PM-moved to 1100 [address] notified pt  . DILATION AND CURETTAGE OF UTERUS     Medications: Reviewed & Updated - see associated section                       Current Outpatient Prescriptions:  .  ALPRAZolam (XANAX) 1 MG tablet, Take 1 mg by mouth daily as needed for anxiety. , Disp: , Rfl:  .  citalopram (CELEXA) 20 MG tablet, Take 20 mg by mouth daily., Disp: , Rfl:  .  Enalapril-Hydrochlorothiazide 5-12.5 MG per tablet, Take 1 tablet by mouth daily., Disp: , Rfl:  .  estradiol (ESTRACE) 2 MG tablet, TAKE 1 TABLET (2 MG TOTAL) BY MOUTH DAILY. (Patient not taking: Reported on 10/11/2016), Disp: 90 tablet, Rfl: 0 .  estradiol (ESTRACE) 2 MG tablet, TAKE 1 TABLET (2 MG TOTAL) BY MOUTH DAILY.  (Patient not taking: Reported on 10/11/2016), Disp: 90 tablet, Rfl: 1   Social History: Reviewed -  reports that she has never smoked. She has never used smokeless tobacco.  Objective Findings:  Vitals: Blood pressure 120/62, pulse (!) 56, height 5\' 1"  (1.549 m), weight 167 lb (75.8 kg).  Physical Examination: Discussion only    Assessment & Plan:   A:  1. Requesting refill of 2 mg Estradiol for vasomotor symptoms   P:  1. Rx 1 mg Estradiol  2. F/u in 1 year for refill     By signing my name below, I, Doreatha MartinEva Mathews, attest that this documentation has been prepared under the direction and in the presence of Tilda BurrowFerguson, Trayvond Viets V, MD. Electronically Signed: Doreatha MartinEva Mathews, ED Scribe. 10/11/16. 9:16 AM.  I personally performed the services described in this documentation, which was SCRIBED in my presence. The recorded information has been reviewed and considered accurate. It has been edited as necessary during review. Tilda BurrowFERGUSON,Charie Pinkus V, MD

## 2016-10-11 NOTE — Telephone Encounter (Signed)
Changed to 1 mg/ d today

## 2016-11-12 ENCOUNTER — Emergency Department (HOSPITAL_COMMUNITY): Payer: Worker's Compensation

## 2016-11-12 ENCOUNTER — Encounter (HOSPITAL_COMMUNITY): Payer: Self-pay | Admitting: Emergency Medicine

## 2016-11-12 ENCOUNTER — Emergency Department (HOSPITAL_COMMUNITY)
Admission: EM | Admit: 2016-11-12 | Discharge: 2016-11-12 | Disposition: A | Discharge status: Home / Self Care | Payer: Worker's Compensation | Attending: Physician Assistant | Admitting: Physician Assistant

## 2016-11-12 DIAGNOSIS — Y939 Activity, unspecified: Secondary | ICD-10-CM | POA: Insufficient documentation

## 2016-11-12 DIAGNOSIS — W010XXA Fall on same level from slipping, tripping and stumbling without subsequent striking against object, initial encounter: Secondary | ICD-10-CM | POA: Insufficient documentation

## 2016-11-12 DIAGNOSIS — S99912A Unspecified injury of left ankle, initial encounter: Secondary | ICD-10-CM | POA: Diagnosis present

## 2016-11-12 DIAGNOSIS — S82899A Other fracture of unspecified lower leg, initial encounter for closed fracture: Secondary | ICD-10-CM | POA: Diagnosis not present

## 2016-11-12 DIAGNOSIS — Y999 Unspecified external cause status: Secondary | ICD-10-CM | POA: Diagnosis not present

## 2016-11-12 DIAGNOSIS — I10 Essential (primary) hypertension: Secondary | ICD-10-CM | POA: Diagnosis not present

## 2016-11-12 DIAGNOSIS — Y929 Unspecified place or not applicable: Secondary | ICD-10-CM | POA: Diagnosis not present

## 2016-11-12 DIAGNOSIS — Z79899 Other long term (current) drug therapy: Secondary | ICD-10-CM | POA: Insufficient documentation

## 2016-11-12 DIAGNOSIS — S82892A Other fracture of left lower leg, initial encounter for closed fracture: Secondary | ICD-10-CM

## 2016-11-12 MED ORDER — IBUPROFEN 800 MG PO TABS
800.0000 mg | ORAL_TABLET | Freq: Once | ORAL | Status: DC
  Filled 2016-11-12: qty 1

## 2016-11-12 MED ORDER — OXYCODONE-ACETAMINOPHEN 5-325 MG PO TABS: 1.0000 | ORAL_TABLET | Freq: Four times a day (QID) | ORAL | 0 refills | Status: DC | PRN

## 2016-11-12 MED ORDER — OXYCODONE-ACETAMINOPHEN 5-325 MG PO TABS
1.0000 | ORAL_TABLET | Freq: Once | ORAL | Status: AC
  Administered 2016-11-12: 1 via ORAL
  Filled 2016-11-12: qty 1

## 2016-11-12 NOTE — Discharge Instructions (Signed)
Please rest, ice and elevate and follow up with Dr. Linna CapriceSwinteck this week. No weight bearing.

## 2016-11-12 NOTE — ED Notes (Signed)
The patient was at work and fell.  Unknown how she fell according to EMS, no obvious deformity to the left ankle.  She rates her pain 6/10.  EMS transported patient here to be evaluated.

## 2016-11-12 NOTE — Progress Notes (Signed)
Orthopedic Tech Progress Note Patient Details:  Allison [name] 12-18-1960 [id number]  Ortho Devices Type of Ortho Device: Ace wrap, Crutches, Post (short leg) splint, Stirrup splint Ortho Device/Splint Location: lle Ortho Device/Splint Interventions: Application   [name] 11/12/2016, 3:35 PM

## 2016-11-12 NOTE — ED Triage Notes (Signed)
The patient said she was at work and slipped on a rug and twisted her ankle.  She said when she fell she hit her head on a piece of metal.  She has no pain unless someone touches it.  She denies LOC, blurred vision, not on blood thinners and no other symptoms.

## 2016-11-12 NOTE — ED Provider Notes (Signed)
[facility]-EMERGENCY DEPT Provider Note   CSN: [id number] Arrival date & time: 11/12/16  1119  By signing my name below, I, Thelma Barge, attest that this documentation has been prepared under the direction and in the presence of Tameyah Koch Lyn, *. Electronically Signed: Thelma Barge, Scribe. 11/12/16. 12:33 PM.  History   Chief Complaint Chief Complaint  Patient presents with  . Ankle Pain    The patient said she was at work and slipped on a rug and twisted her ankle.  She said when she fell she hit her head on a piece of metal.    The history is provided by the patient. No language interpreter was used.    HPI Comments: Allison [name] is a 56 y.o. female who presents to the Emergency Department complaining of constant, gradually worsening left-sided ankle pain s/p a fall that occurred 2-3 hours ago. She states a rug slipped out from under her and she twisted her ankle. She has associated swelling to the area. She denies any other associated symptoms.   Past Medical History:  Diagnosis Date  . Hypercholesteremia   . Hypertension     Patient Active Problem List   Diagnosis Date Noted  . Post hysterectomy menopause 10/11/2016    Past Surgical History:  Procedure Laterality Date  . ABDOMINAL HYSTERECTOMY    . ADENOIDECTOMY    . CESAREAN SECTION     X 3  . COLONOSCOPY N/A 04/14/2013   Procedure: COLONOSCOPY;  Surgeon: Corbin Ade, MD;  Location: [facility];  Service: Endoscopy;  Laterality: N/A;  2:15 PM-moved to 1100 [address] notified pt  . DILATION AND CURETTAGE OF UTERUS      OB History    No data available       Home Medications    Prior to Admission medications   Medication Sig Start Date End Date Taking? Authorizing Provider  ALPRAZolam Prudy Feeler) 1 MG tablet Take 1 mg by mouth daily as needed for anxiety.     [provider]  citalopram (CELEXA) 20 MG tablet Take 20 mg by mouth daily.    [provider]  Enalapril-Hydrochlorothiazide  5-12.5 MG per tablet Take 1 tablet by mouth daily.    [provider]  estradiol (ESTRACE) 1 MG tablet Take 1 tablet (1 mg total) by mouth daily. 10/11/16   Tilda Burrow, MD  oxyCODONE-acetaminophen (PERCOCET/ROXICET) 5-325 MG tablet Take 1 tablet by mouth every 6 (six) hours as needed for severe pain. 11/12/16   Kalan Rinn, Cindee Salt, MD    Family History Family History  Problem Relation Age of Onset  . Colon cancer Sister   . Hypertension Sister   . Diabetes Brother   . Hypertension Brother     Social History Social History  Substance Use Topics  . Smoking status: Never Smoker  . Smokeless tobacco: Never Used  . Alcohol use No     Allergies   Patient has no known allergies.   Review of Systems Review of Systems  Musculoskeletal: Positive for arthralgias and joint swelling.  All other systems reviewed and are negative.    Physical Exam Updated Vital Signs BP (!) 148/80 (BP Location: Right Arm)   Pulse 70   Temp 99.2 F (37.3 C) (Oral)   Resp 18   Ht 5\' 1"  (1.549 m)   Wt 76.2 kg (168 lb)   SpO2 96%   BMI 31.74 kg/m   Physical Exam  Constitutional: She is oriented to person, place, and time. She  appears well-developed and well-nourished.  HENT:  Head: Normocephalic.  Eyes: EOM are normal.  Neck: Normal range of motion.  Pulmonary/Chest: Effort normal.  Abdominal: She exhibits no distension.  Musculoskeletal: Normal range of motion.  Swelling to the medial and lateral malleolus Good cap refill distally Sensation intact ROM limited by pain  Neurological: She is alert and oriented to person, place, and time.  Psychiatric: She has a normal mood and affect.  Nursing note and vitals reviewed.    ED Treatments / Results  DIAGNOSTIC STUDIES: Oxygen Saturation is 96% on RA, normal by my interpretation.    COORDINATION OF CARE: 12:32 PM Discussed treatment plan with pt at bedside and pt agreed to plan.  Labs (all labs ordered are listed, but  only abnormal results are displayed) Labs Reviewed - No data to display  EKG  EKG Interpretation None       Radiology Dg Ankle Complete Left  Result Date: 11/12/2016 CLINICAL DATA:  Patient status post fall.  Initial encounter. EXAM: LEFT ANKLE COMPLETE - 3+ VIEW COMPARISON:  None. FINDINGS: There is an oblique fracture through the distal fibula. Overlying soft tissue swelling. Talar dome is intact. Additionally, on the AP view there is suggestion of cortical irregularity overlying the medial malleolus. Overlying soft tissue swelling. IMPRESSION: Oblique fracture through the distal fibula. Suggestion of possible cortical irregularity involving the medial malleolus. Medial malleolar fracture is not excluded recommend correlation with point tenderness. Electronically Signed   By: Annia Belt M.D.   On: 11/12/2016 13:22   Ct Ankle Left Wo Contrast  Result Date: 11/12/2016 CLINICAL DATA:  Twisting ankle injury during a fall resulting in fracture. EXAM: CT OF THE LEFT ANKLE WITHOUT CONTRAST TECHNIQUE: Multidetector CT imaging of the left ankle was performed according to the standard protocol. Multiplanar CT image reconstructions were also generated. COMPARISON:  11/12/2016 radiographs FINDINGS: Bones/Joint/Cartilage Stage IV Weber B fracture (Supination -external rotation) including a relatively nondisplaced oblique fracture lateral malleolus, an oblique fracture the posterior malleolus, and a transverse (slightly oblique) fracture of the medial malleolus. There is some mild widening of the volar-lateral mortise and narrowing of the medial mortise indicating instability. Plantar and Achilles calcaneal spurs. No other fractures identified. Ligaments Suboptimally assessed by CT. Muscles and Tendons No tendon entrapment. Soft tissues Subcutaneous edema along the medial, lateral, and anterior ankle. Small amount of subcutaneous edema along the heel. IMPRESSION: 1. Stage IV Weber B fracture (supination -Asch  external rotation) of the left ankle. Mild widening of the volar-lateral mortise. Associated regional subcutaneous edema. No flexor tendon entrapment along the fracture planes. 2. Plantar and Achilles calcaneal spurs. Electronically Signed   By: Gaylyn Rong M.D.   On: 11/12/2016 15:13    Procedures Procedures (including critical care time)  Medications Ordered in ED Medications  oxyCODONE-acetaminophen (PERCOCET/ROXICET) 5-325 MG per tablet 1 tablet (1 tablet Oral Given 11/12/16 1423)     Initial Impression / Assessment and Plan / ED Course  I have reviewed the triage vital signs and the nursing notes.  Pertinent labs & imaging results that were available during my care of the patient were reviewed by me and considered in my medical decision making (see chart for details).     I personally performed the services described in this documentation, which was scribed in my presence. The recorded information has been reviewed and is accurate.   Patient is a very pleasant female who had a mechanical trip at work. Patient has a significant swelling to left ankle. Concern for  fracture. X-ray pending.  Fracture to distal fib and medial mal.   3:26 PM Discussed with Swinteck.. We will get CT, splint, follow-up with orthopedic.   SPLINT APPLICATION Date/Time: 3:26 PM Authorized by: Arlana Hoveourteney L Tenaya Hilyer Consent: Verbal consent obtained. Risks and benefits: risks, benefits and alternatives were discussed Consent given by: patient Splint applied by: orthopedic technician Location details: L ankle Splint type: U splint with posterior Supplies used: ortho glass Post-procedure: The splinted body part was neurovascularly unchanged following the procedure. Patient tolerance: Patient tolerated the procedure well with no immediate complications.     Final Clinical Impressions(s) / ED Diagnoses   Final diagnoses:  Closed fracture of left ankle, initial encounter    New  Prescriptions New Prescriptions   OXYCODONE-ACETAMINOPHEN (PERCOCET/ROXICET) 5-325 MG TABLET    Take 1 tablet by mouth every 6 (six) hours as needed for severe pain.     Abelino DerrickMackuen, Finnlee Guarnieri Lyn, MD 11/12/16 1526

## 2017-01-09 ENCOUNTER — Ambulatory Visit: Payer: Worker's Compensation | Attending: Orthopedic Surgery | Admitting: Physical Therapy

## 2017-01-09 DIAGNOSIS — M25572 Pain in left ankle and joints of left foot: Secondary | ICD-10-CM

## 2017-01-09 DIAGNOSIS — M25672 Stiffness of left ankle, not elsewhere classified: Secondary | ICD-10-CM | POA: Insufficient documentation

## 2017-01-09 NOTE — Therapy (Signed)
[facility] [address] [place], [place], [address] Phone: [phone]   Fax:  [phone]  Physical Therapy Evaluation  Patient Details  Name: Allison [name] MRN: [id number] Date of Birth: 21-Jan-1961 Referring Provider: Toni Arthurs MD.  Encounter Date: 01/09/2017      PT End of Session - 01/09/17 1239    Visit Number 1   Number of Visits 12   Date for PT Re-Evaluation 02/06/17   PT Start Time 1115   PT Stop Time 1207   PT Time Calculation (min) 52 min   Activity Tolerance Patient tolerated treatment well   Behavior During Therapy [facility] for tasks assessed/performed      Past Medical History:  Diagnosis Date  . Hypercholesteremia   . Hypertension     Past Surgical History:  Procedure Laterality Date  . ABDOMINAL HYSTERECTOMY    . ADENOIDECTOMY    . CESAREAN SECTION     X 3  . COLONOSCOPY N/A 04/14/2013   Procedure: COLONOSCOPY;  Surgeon: Corbin Ade, MD;  [facility];  Service: Endoscopy;  Laterality: N/A;  2:15 PM-moved to [address] notified pt  . DILATION AND CURETTAGE OF UTERUS      There were no vitals filed for this visit.       Subjective Assessment - 01/09/17 1241    Subjective The patient sustained a work related injury on 11/12/16 resulting in a left ankle fracture and a subsquent ORIF performed on 11/21/16.  She was in a cast but this was removed on 01/03/17 when she was placed in a CAM boot WBAT with bilateral axillary crutuches.  She has been moving her ankle some out of her boot when lying down and this has made it feel better.  Her pain is low today at rest.  She reports having some mild "nerve pain."   Limitations Walking   How long can you walk comfortably? CAM on left WBAT with bilateral axillary crutuches.   Patient Stated Goals Get back to normal.   Currently in Pain? Yes   Pain Score 1    Pain Location Ankle   Pain Orientation Left   Pain Descriptors / Indicators Dull   Pain Type  Surgical pain   Pain Onset More than a month ago   Pain Frequency Intermittent   Aggravating Factors  See above.   Pain Relieving Factors See above.            [facility] PT Assessment - 01/09/17 0001      Assessment   Medical Diagnosis Trimalleolar fracture, closed.   Referring Provider Toni Arthurs MD.   Onset Date/Surgical Date --  11/12/16 (DOI).     Precautions   Precautions --  Left CAM boot.     Restrictions   Weight Bearing Restrictions Yes   RUE Weight Bearing --  WBAT in CAM boot.     Balance Screen   Has the patient fallen in the past 6 months No   Has the patient had a decrease in activity level because of a fear of falling?  No   Is the patient reluctant to leave their home because of a fear of falling?  No     Home Environment   Living Environment Private residence     Prior Function   Level of Independence Independent     Observation/Other Assessments   Observations Left ankle incisions appear well healed.  Notable left calf atrophy.     Observation/Other Assessments-Edema  Edema --  Min+ left ankle edema.     ROM / Strength   AROM / PROM / Strength AROM     AROM   Overall AROM Comments Active left ankle dorsiflexion -15 degrees from neutral; plantarflexion= 44 degrees; eversion= 5 degrees and inversion= 10 degrees.     Palpation   Palpation comment Mild palpable tenderness around the patient's left ankle incisional sites.     Ambulation/Gait   Gait Comments WBAT with left CAM boot and bilateral axillary crutches.            Objective measurements completed on examination: See above findings.          [facility] Adult PT Treatment/Exercise - 01/09/17 0001      Modalities   Modalities Vasopneumatic     Vasopneumatic   Number Minutes Vasopneumatic  20 minutes   Vasopnuematic Location  --  Left ankle.   Vasopneumatic Pressure Medium                     PT Long Term Goals - 01/09/17 1256      PT LONG TERM GOAL #1    Title Independent with a HEP.   Time 4   Period Weeks   Status New     PT LONG TERM GOAL #2   Title Increase left ankle dorsiflexion to 6- 8 degrees to normalize the patient's gait pattern.   Time 4   Period Weeks   Status New     PT LONG TERM GOAL #3   Title Increase left ankle strength to 4+ to 5/5 to increase stability for functional tasks.   Time 4   Period Weeks   Status New     PT LONG TERM GOAL #4   Title Walk without deviation in clinic 500 feet.   Time 4   Period Weeks   Status New     PT LONG TERM GOAL #5   Title Perform ADL's with pain not > 2/10.   Period Weeks   Status New                Plan - 01/09/17 1252    Clinical Impression Statement The patient prsents to OPPT s/p left ankle ORIF die to a trimalleolar fracture.  She is WBAT with a left CAM boot using bilateral axillary crutches today.  She has a loss of range of motion and deficits limit her functional mobility.  Her pain is low todya dn she does not have a great deal od left ankle edema.Patient will benefit from skilled physical therapy.   Clinical Presentation Stable   Clinical Presentation due to: Good surgical outcome.   Clinical Decision Making Low   Rehab Potential Excellent   PT Frequency 3x / week   PT Duration 4 weeks   PT Treatment/Interventions ADLs/Self Care Home Management;Cryotherapy;Electrical Stimulation;Moist Heat;Patient/family education;Therapeutic exercise;Therapeutic activities;Neuromuscular re-education;Manual techniques;Vasopneumatic Device   PT Next Visit Plan PROM to left ankle, seated Rockerboard and BAPs.  Wean to one crutch.  Vasopneumatic.   Consulted and Agree with Plan of Care Patient      Patient will benefit from skilled therapeutic intervention in order to improve the following deficits and impairments:  Abnormal gait, Decreased activity tolerance, Pain, Increased edema, Decreased range of motion  Visit Diagnosis: Pain in left ankle and joints of left foot -  Plan: PT plan of care cert/re-cert  Stiffness of left ankle, not elsewhere classified - Plan: PT plan of care cert/re-cert  G-Codes - 01/09/17 1240    Functional Assessment Tool Used (Outpatient Only) FOTO...53% limitation.   Functional Limitation Mobility: Walking and moving around   Mobility: Walking and Moving Around Current Status [phone]) At least 40 percent but less than 60 percent impaired, limited or restricted   Mobility: Walking and Moving Around Goal Status [phone]) At least 1 percent but less than 20 percent impaired, limited or restricted       Problem List Patient Active Problem List   Diagnosis Date Noted  . Post hysterectomy menopause 10/11/2016    Geniyah Eischeid, [place] MPT 01/09/2017, 1:00 PM  [facility] [address] [place], [place], [address] Phone: [phone]   Fax:  [phone]  Name: Allison [name] MRN: [id number] Date of Birth: 10/27/1960

## 2017-01-16 ENCOUNTER — Ambulatory Visit: Payer: Worker's Compensation | Admitting: Physical Therapy

## 2017-01-16 DIAGNOSIS — M25572 Pain in left ankle and joints of left foot: Secondary | ICD-10-CM | POA: Diagnosis not present

## 2017-01-16 DIAGNOSIS — M25672 Stiffness of left ankle, not elsewhere classified: Secondary | ICD-10-CM

## 2017-01-16 NOTE — Therapy (Signed)
[facility] [address] [place], [place], [address] Phone: [phone]   Fax:  [phone]  Physical Therapy Treatment  Patient Details  Name: Allison [name] MRN: [id number] Date of Birth: 1961/03/23 Referring Provider: Toni ArthursJohn Hewitt MD.  Encounter Date: 01/16/2017      PT End of Session - 01/16/17 1735    Visit Number 2   Number of Visits 12   Date for PT Re-Evaluation 02/06/17   PT Start Time 0448   PT Stop Time 0543   PT Time Calculation (min) 55 min   Activity Tolerance Patient tolerated treatment well   Behavior During Therapy [facility] for tasks assessed/performed      Past Medical History:  Diagnosis Date  . Hypercholesteremia   . Hypertension     Past Surgical History:  Procedure Laterality Date  . ABDOMINAL HYSTERECTOMY    . ADENOIDECTOMY    . CESAREAN SECTION     X 3  . COLONOSCOPY N/A 04/14/2013   Procedure: COLONOSCOPY;  Surgeon: Corbin Adeobert M Rourk, MD;  [facility];  Service: Endoscopy;  Laterality: N/A;  2:15 PM-moved to [address] notified pt  . DILATION AND CURETTAGE OF UTERUS      There were no vitals filed for this visit.      Subjective Assessment - 01/16/17 1737    Subjective I was at work all day and my ankle swells.  My incisions hurt.   Pain Score 2    Pain Location Ankle   Pain Orientation Left   Pain Descriptors / Indicators Dull   Pain Type Surgical pain   Pain Onset More than a month ago                         [facility] - 01/16/17 0001      Modalities   Modalities Vasopneumatic     Vasopneumatic   Number Minutes Vasopneumatic  20 minutes   Vasopnuematic Location  --  Left ankle.   Vasopneumatic Pressure Medium     Manual Therapy   Manual Therapy Soft tissue mobilization;Passive ROM   Manual therapy comments In supine:  Left ankle PROM.  Also, soft tissue and scar mobilization (23 minutes).                     PT  Long Term Goals - 01/09/17 1256      PT LONG TERM GOAL #1   Title Independent with a HEP.   Time 4   Period Weeks   Status New     PT LONG TERM GOAL #2   Title Increase left ankle dorsiflexion to 6- 8 degrees to normalize the patient's gait pattern.   Time 4   Period Weeks   Status New     PT LONG TERM GOAL #3   Title Increase left ankle strength to 4+ to 5/5 to increase stability for functional tasks.   Time 4   Period Weeks   Status New     PT LONG TERM GOAL #4   Title Walk without deviation in clinic 500 feet.   Time 4   Period Weeks   Status New     PT LONG TERM GOAL #5   Title Perform ADL's with pain not > 2/10.   Period Weeks   Status New               Plan - 01/16/17 1747    Clinical  Impression Statement Patient did great today and states that STW/M was very helpful.   PT Next Visit Plan PROM to left ankle, seated Rockerboard and BAPs.  Wean to one crutch.  Vasopneumatic.      Patient will benefit from skilled therapeutic intervention in order to improve the following deficits and impairments:     Visit Diagnosis: Pain in left ankle and joints of left foot  Stiffness of left ankle, not elsewhere classified     Problem List Patient Active Problem List   Diagnosis Date Noted  . Post hysterectomy menopause 10/11/2016    [place], [place]  MPT 01/16/2017, 5:48 PM  [facility] [address] [place], [place], [address] Phone: [phone]   Fax:  [phone]  Name: Allison [name] MRN: [id number] Date of Birth: Oct 08, 1960

## 2017-01-18 ENCOUNTER — Ambulatory Visit: Payer: Worker's Compensation | Admitting: *Deleted

## 2017-01-18 DIAGNOSIS — M25572 Pain in left ankle and joints of left foot: Secondary | ICD-10-CM | POA: Diagnosis not present

## 2017-01-18 DIAGNOSIS — M25672 Stiffness of left ankle, not elsewhere classified: Secondary | ICD-10-CM

## 2017-01-18 NOTE — Therapy (Signed)
[facility] [address] [place], [place], [address] Phone: [phone]   Fax:  [phone]  Physical Therapy Treatment  Patient Details  Name: Allison [name] MRN: [id number] Date of Birth: [birth year] Referring Provider: Toni Arthurs MD.  Encounter Date: 01/18/2017      PT End of Session - 01/18/17 1648    Visit Number 3   Number of Visits 12   Date for PT Re-Evaluation 02/06/17   PT Start Time 1645   PT Stop Time 1737   PT Time Calculation (min) 52 min      Past Medical History:  Diagnosis Date  . Hypercholesteremia   . Hypertension     Past Surgical History:  Procedure Laterality Date  . ABDOMINAL HYSTERECTOMY    . ADENOIDECTOMY    . CESAREAN SECTION     X 3  . COLONOSCOPY N/A 04/14/2013   Procedure: COLONOSCOPY;  Surgeon: Corbin Ade, MD;  [facility];  Service: Endoscopy;  Laterality: N/A;  2:15 PM-moved to [address] notified pt  . DILATION AND CURETTAGE OF UTERUS      There were no vitals filed for this visit.      Subjective Assessment - 01/18/17 1653    Subjective I was at work all day and my ankle swells.  My incisions hurts. Trying to wean to 1 crutch   Limitations Walking   How long can you walk comfortably? CAM on left WBAT with bilateral axillary crutuches.   Patient Stated Goals Get back to normal.   Currently in Pain? Yes   Pain Score 3    Pain Location Ankle   Pain Orientation Left   Pain Descriptors / Indicators Dull   Pain Onset More than a month ago                         [facility] Adult PT Treatment/Exercise - 01/18/17 0001      Exercises   Exercises Ankle     Modalities   Modalities Vasopneumatic;Electrical Stimulation     Electrical Stimulation   Electrical Stimulation Location IFC x15 mins 1-10 hz LT ankle   Electrical Stimulation Goals Pain;Edema     Vasopneumatic   Number Minutes Vasopneumatic  20 minutes   Vasopnuematic Location  --  Left ankle.    Vasopneumatic Pressure Medium   Vasopneumatic Temperature  36     Manual Therapy   Manual Therapy Soft tissue mobilization;Passive ROM   Manual therapy comments In supine:  Left ankle PROM.  Also, soft tissue and scar mobilization      Ankle Exercises: Seated   Other Seated Ankle Exercises rockerboard PF/DF x 3 mins, Ev/INV x 3 mins, Dyna disc x 3 CW/CCW                     PT Long Term Goals - 01/09/17 1256      PT LONG TERM GOAL #1   Title Independent with a HEP.   Time 4   Period Weeks   Status New     PT LONG TERM GOAL #2   Title Increase left ankle dorsiflexion to 6- 8 degrees to normalize the patient's gait pattern.   Time 4   Period Weeks   Status New     PT LONG TERM GOAL #3   Title Increase left ankle strength to 4+ to 5/5 to increase stability for functional tasks.   Time 4   Period Weeks  Status New     PT LONG TERM GOAL #4   Title Walk without deviation in clinic 500 feet.   Time 4   Period Weeks   Status New     PT LONG TERM GOAL #5   Title Perform ADL's with pain not > 2/10.   Period Weeks   Status New               Plan - 01/18/17 1649    Clinical Impression Statement Pt arrived to clinic ambulating in CAM boot and 2 crutches. She reports trying to wean from 1 crutch, but the evenings are the worst. She was able to perform seated ROM exs with minimal pain with EV and Inv being the most challenging.. Normal response to modalities... DF to neutral   Clinical Presentation Stable   Clinical Decision Making Low   Rehab Potential Excellent   PT Frequency 3x / week   PT Duration 4 weeks   PT Treatment/Interventions ADLs/Self Care Home Management;Cryotherapy;Electrical Stimulation;Moist Heat;Patient/family education;Therapeutic exercise;Therapeutic activities;Neuromuscular re-education;Manual techniques;Vasopneumatic Device   PT Next Visit Plan PROM to left ankle, seated Rockerboard and BAPs.  Wean to one crutch.  Vasopneumatic.    Consulted and Agree with Plan of Care Patient      Patient will benefit from skilled therapeutic intervention in order to improve the following deficits and impairments:  Abnormal gait, Decreased activity tolerance, Pain, Increased edema, Decreased range of motion  Visit Diagnosis: Pain in left ankle and joints of left foot  Stiffness of left ankle, not elsewhere classified     Problem List Patient Active Problem List   Diagnosis Date Noted  . Post hysterectomy menopause 10/11/2016    Kayan Blissett,CHRIS, PTA 01/18/2017, 5:39 PM  [facility] [address] [place], [place], [address] Phone: [phone]   Fax:  [phone]  Name: Allison [name] MRN: [id number] Date of Birth: 1960/08/01

## 2017-01-23 ENCOUNTER — Encounter: Payer: Self-pay | Admitting: Physical Therapy

## 2017-01-23 ENCOUNTER — Ambulatory Visit: Payer: Worker's Compensation | Admitting: Physical Therapy

## 2017-01-23 DIAGNOSIS — M25572 Pain in left ankle and joints of left foot: Secondary | ICD-10-CM | POA: Diagnosis not present

## 2017-01-23 DIAGNOSIS — M25672 Stiffness of left ankle, not elsewhere classified: Secondary | ICD-10-CM

## 2017-01-23 NOTE — Therapy (Signed)
[facility] [address] [place], [place], [address] Phone: [phone]   Fax:  [phone]  Physical Therapy Treatment  Patient Details  Name: Allison [name] MRN: [id number] Date of Birth: Jul 31, 1960 Referring Provider: Toni Arthurs MD.  Encounter Date: 01/23/2017      PT End of Session - 01/23/17 1652    Visit Number 4   Number of Visits 12   Date for PT Re-Evaluation 02/06/17   PT Start Time 1647   PT Stop Time 1732   PT Time Calculation (min) 45 min   Activity Tolerance Patient tolerated treatment well   Behavior During Therapy [facility] for tasks assessed/performed      Past Medical History:  Diagnosis Date  . Hypercholesteremia   . Hypertension     Past Surgical History:  Procedure Laterality Date  . ABDOMINAL HYSTERECTOMY    . ADENOIDECTOMY    . CESAREAN SECTION     X 3  . COLONOSCOPY N/A 04/14/2013   Procedure: COLONOSCOPY;  Surgeon: Corbin Ade, MD;  [facility];  Service: Endoscopy;  Laterality: N/A;  2:15 PM-moved to [address] notified pt  . DILATION AND CURETTAGE OF UTERUS      There were no vitals filed for this visit.      Subjective Assessment - 01/23/17 1650    Subjective Reports that a sensation has started in the last week and feeling like the medial L foot and the bottom of her foot.   Limitations Walking   How long can you walk comfortably? CAM on left WBAT with bilateral axillary crutuches.   Patient Stated Goals Get back to normal.   Currently in Pain? Yes   Pain Score 2    Pain Location Ankle   Pain Orientation Left   Pain Descriptors / Indicators Tightness   Pain Type Surgical pain   Pain Onset More than a month ago            [facility] PT Assessment - 01/23/17 0001      Assessment   Medical Diagnosis Trimalleolar fracture, closed.   Onset Date/Surgical Date 11/12/16   Next MD Visit 01/31/2017     Restrictions   Weight Bearing Restrictions Yes                      [facility] Adult PT Treatment/Exercise - 01/23/17 0001      Modalities   Modalities Vasopneumatic;Electrical Stimulation     Electrical Stimulation   Electrical Stimulation Location L ankle   Electrical Stimulation Action IFC   Electrical Stimulation Parameters 1-10 hz x15 min   Electrical Stimulation Goals Pain;Edema     Vasopneumatic   Number Minutes Vasopneumatic  15 minutes   Vasopnuematic Location  Ankle   Vasopneumatic Pressure Medium   Vasopneumatic Temperature  34     Manual Therapy   Manual Therapy Passive ROM;Soft tissue mobilization   Soft tissue mobilization L ankle incision mobilizations to reduce incision adhesions and promote mobility   Passive ROM Passive L gastroc stretch 4*20 sec     Ankle Exercises: Seated   ABC's 1 rep   Ankle Circles/Pumps AROM;Left;20 reps  squares, triangles   Heel Raises 20 reps   Toe Raise 20 reps   Other Seated Ankle Exercises Rockerboard DF/PF x3 min, Inv/Ev x3 min   Other Seated Ankle Exercises Dyandisc x5 min  PT Long Term Goals - 01/09/17 1256      PT LONG TERM GOAL #1   Title Independent with a HEP.   Time 4   Period Weeks   Status New     PT LONG TERM GOAL #2   Title Increase left ankle dorsiflexion to 6- 8 degrees to normalize the patient's gait pattern.   Time 4   Period Weeks   Status New     PT LONG TERM GOAL #3   Title Increase left ankle strength to 4+ to 5/5 to increase stability for functional tasks.   Time 4   Period Weeks   Status New     PT LONG TERM GOAL #4   Title Walk without deviation in clinic 500 feet.   Time 4   Period Weeks   Status New     PT LONG TERM GOAL #5   Title Perform ADL's with pain not > 2/10.   Period Weeks   Status New               Plan - 01/23/17 1742    Clinical Impression Statement Patient arrived to clinic with reports of L ankle tightness and with reports of cold sensation felt in the plantar  surface of L foot. Patient guided through L ankle ROM exercises without complaint by patient only reports of L ankle ROM limitations. Patient ambulated into clinic with CAM boot donned and one crutch. Good L ankle incision mobility with edema present throughout L ankle region. Normal modalities response noted following removal of the modalities.   Rehab Potential Excellent   PT Frequency 3x / week   PT Duration 4 weeks   PT Treatment/Interventions ADLs/Self Care Home Management;Cryotherapy;Electrical Stimulation;Moist Heat;Patient/family education;Therapeutic exercise;Therapeutic activities;Neuromuscular re-education;Manual techniques;Vasopneumatic Device   PT Next Visit Plan Continue seated ROM exercises with gait training as needed with modalities PRN per MPT POC.   Consulted and Agree with Plan of Care Patient      Patient will benefit from skilled therapeutic intervention in order to improve the following deficits and impairments:  Abnormal gait, Decreased activity tolerance, Pain, Increased edema, Decreased range of motion  Visit Diagnosis: Pain in left ankle and joints of left foot  Stiffness of left ankle, not elsewhere classified     Problem List Patient Active Problem List   Diagnosis Date Noted  . Post hysterectomy menopause 10/11/2016    [name], PTA 01/23/2017, 5:48 PM  [facility] [address] [place], [place], [address] Phone: [phone]   Fax:  [phone]  Name: Allison [name] MRN: [id number] Date of Birth: [birth year]

## 2017-01-25 ENCOUNTER — Ambulatory Visit: Payer: Worker's Compensation | Admitting: Physical Therapy

## 2017-01-25 ENCOUNTER — Encounter: Payer: Self-pay | Admitting: Physical Therapy

## 2017-01-25 DIAGNOSIS — M25572 Pain in left ankle and joints of left foot: Secondary | ICD-10-CM

## 2017-01-25 DIAGNOSIS — M25672 Stiffness of left ankle, not elsewhere classified: Secondary | ICD-10-CM

## 2017-01-25 NOTE — Therapy (Signed)
[facility] [address] [place], [place], [address] Phone: [phone]   Fax:  [phone]  Physical Therapy Treatment  Patient Details  Name: Allison [name] MRN: [id number] Date of Birth: [birth year] Referring Provider: Toni Arthurs MD.  Encounter Date: 01/25/2017      PT End of Session - 01/25/17 1739    Visit Number 5   Number of Visits 12   Date for PT Re-Evaluation 02/06/17   PT Start Time 0445   PT Stop Time 0541   PT Time Calculation (min) 56 min   Activity Tolerance Patient tolerated treatment well   Behavior During Therapy [facility] for tasks assessed/performed      Past Medical History:  Diagnosis Date  . Hypercholesteremia   . Hypertension     Past Surgical History:  Procedure Laterality Date  . ABDOMINAL HYSTERECTOMY    . ADENOIDECTOMY    . CESAREAN SECTION     X 3  . COLONOSCOPY N/A 04/14/2013   Procedure: COLONOSCOPY;  Surgeon: Corbin Ade, MD;  [facility];  Service: Endoscopy;  Laterality: N/A;  2:15 PM-moved to [address] notified pt  . DILATION AND CURETTAGE OF UTERUS      There were no vitals filed for this visit.      Subjective Assessment - 01/25/17 1740    Subjective I'm doing much better one one crutch.  Ankle swells by end of day.   Pain Score 2    Pain Location Ankle   Pain Orientation Left   Pain Descriptors / Indicators Tightness   Pain Type Surgical pain   Pain Onset More than a month ago                         [facility] Adult PT Treatment/Exercise - 01/25/17 0001      Modalities   Modalities Electrical Stimulation;Vasopneumatic     Electrical Stimulation   Electrical Stimulation Location Left ankle.   Electrical Stimulation Action IFC   Electrical Stimulation Parameters 1-10 Hz constant x 20 minutes.   Electrical Stimulation Goals Edema;Pain     Vasopneumatic   Number Minutes Vasopneumatic  20 minutes   Vasopnuematic Location  --  Left ankle.   Vasopneumatic Pressure Medium     Manual Therapy   Manual Therapy Passive ROM   Passive ROM PROM to left ankle in supine all direction x 25 minutes and also included scar mobilization.                     PT Long Term Goals - 01/09/17 1256      PT LONG TERM GOAL #1   Title Independent with a HEP.   Time 4   Period Weeks   Status New     PT LONG TERM GOAL #2   Title Increase left ankle dorsiflexion to 6- 8 degrees to normalize the patient's gait pattern.   Time 4   Period Weeks   Status New     PT LONG TERM GOAL #3   Title Increase left ankle strength to 4+ to 5/5 to increase stability for functional tasks.   Time 4   Period Weeks   Status New     PT LONG TERM GOAL #4   Title Walk without deviation in clinic 500 feet.   Time 4   Period Weeks   Status New     PT LONG TERM GOAL #5   Title Perform ADL's with  pain not > 2/10.   Period Weeks   Status New               Plan - 01/25/17 1758    Clinical Impression Statement The patient is making very good progress.  PROM in all directions is markedly improved.   Consulted and Agree with Plan of Care Patient      Patient will benefit from skilled therapeutic intervention in order to improve the following deficits and impairments:  Abnormal gait, Decreased activity tolerance, Pain, Increased edema, Decreased range of motion  Visit Diagnosis: Pain in left ankle and joints of left foot  Stiffness of left ankle, not elsewhere classified     Problem List Patient Active Problem List   Diagnosis Date Noted  . Post hysterectomy menopause 10/11/2016    Reem Fleury, [place] MPT 01/25/2017, 6:01 PM  [facility] [address] [place], [place], [address] Phone: [phone]   Fax:  [phone]  Name: Allison [name] MRN: [id number] Date of Birth: 1960-07-31

## 2017-01-30 ENCOUNTER — Ambulatory Visit: Payer: Worker's Compensation | Admitting: *Deleted

## 2017-01-30 DIAGNOSIS — M25572 Pain in left ankle and joints of left foot: Secondary | ICD-10-CM

## 2017-01-30 DIAGNOSIS — M25672 Stiffness of left ankle, not elsewhere classified: Secondary | ICD-10-CM

## 2017-01-30 NOTE — Therapy (Signed)
[facility] [address] [place], [place], [address] Phone: [phone]   Fax:  [phone]  Physical Therapy Treatment  Patient Details  Name: Allison [name] MRN: [id number] Date of Birth: 12/11/1960 Referring Provider: Toni Arthurs MD.  Encounter Date: 01/30/2017      PT End of Session - 01/30/17 1724    Visit Number 6   Number of Visits 12   Date for PT Re-Evaluation 02/06/17   PT Start Time 1645   PT Stop Time 1743   PT Time Calculation (min) 58 min      Past Medical History:  Diagnosis Date  . Hypercholesteremia   . Hypertension     Past Surgical History:  Procedure Laterality Date  . ABDOMINAL HYSTERECTOMY    . ADENOIDECTOMY    . CESAREAN SECTION     X 3  . COLONOSCOPY N/A 04/14/2013   Procedure: COLONOSCOPY;  Surgeon: Corbin Ade, MD;  [facility];  Service: Endoscopy;  Laterality: N/A;  2:15 PM-moved to [address] notified pt  . DILATION AND CURETTAGE OF UTERUS      There were no vitals filed for this visit.      Subjective Assessment - 01/30/17 1652    Subjective I'm doing much better one one crutch.  Ankle swells by end of day. To MD tomorrow   Limitations Walking   How long can you walk comfortably? CAM on left WBAT with bilateral axillary crutuches.   Patient Stated Goals Get back to normal.   Currently in Pain? Yes   Pain Score 2    Pain Location Ankle   Pain Orientation Left   Pain Descriptors / Indicators Tightness                                      PT Long Term Goals - 01/09/17 1256      PT LONG TERM GOAL #1   Title Independent with a HEP.   Time 4   Period Weeks   Status New     PT LONG TERM GOAL #2   Title Increase left ankle dorsiflexion to 6- 8 degrees to normalize the patient's gait pattern.   Time 4   Period Weeks   Status New     PT LONG TERM GOAL #3   Title Increase left ankle strength to 4+ to 5/5 to increase stability for  functional tasks.   Time 4   Period Weeks   Status New     PT LONG TERM GOAL #4   Title Walk without deviation in clinic 500 feet.   Time 4   Period Weeks   Status New     PT LONG TERM GOAL #5   Title Perform ADL's with pain not > 2/10.   Period Weeks   Status New               Plan - 01/30/17 1725    Clinical Impression Statement Pt arrived to clinic doing fairly well with LT ankle. She reports having soreness/ tightness in LT ankle and aches lateral aspect at night. She was able to perform seated AROM exs fairly well and with increased control.. Her AROM was DF to 0 degrees, PF 35 degrees, EV 12 and INV to 20 degrees. Normal response to modalities.Improved FOTO to 41% limited   Clinical Presentation Stable   Rehab Potential Excellent   PT Frequency  3x / week   PT Duration 4 weeks   PT Treatment/Interventions ADLs/Self Care Home Management;Cryotherapy;Electrical Stimulation;Moist Heat;Patient/family education;Therapeutic exercise;Therapeutic activities;Neuromuscular re-education;Manual techniques;Vasopneumatic Device   PT Next Visit Plan To MD tomorrow 01-31-17      Patient will benefit from skilled therapeutic intervention in order to improve the following deficits and impairments:  Abnormal gait, Decreased activity tolerance, Pain, Increased edema, Decreased range of motion  Visit Diagnosis: Pain in left ankle and joints of left foot  Stiffness of left ankle, not elsewhere classified       G-Codes - 02/08/17 1743    Functional Assessment Tool Used (Outpatient Only) 6th visit FOTO 41% limited      Problem List Patient Active Problem List   Diagnosis Date Noted  . Post hysterectomy menopause 10/11/2016    [facility] Ramseur PTA   [place], [place], MPT 01/31/2017, 8:32 AM  [facility] [address] [place], [place], [address] Phone: [phone]   Fax:  [phone]  Name: RYLAND SMOOTS MRN: [id number] Date of  Birth: [birth year]

## 2017-02-01 ENCOUNTER — Ambulatory Visit: Payer: Worker's Compensation | Admitting: *Deleted

## 2017-02-01 DIAGNOSIS — M25572 Pain in left ankle and joints of left foot: Secondary | ICD-10-CM | POA: Diagnosis not present

## 2017-02-01 DIAGNOSIS — M25672 Stiffness of left ankle, not elsewhere classified: Secondary | ICD-10-CM

## 2017-02-01 NOTE — Therapy (Signed)
[facility] [address] [place], [place], [address] Phone: [phone]   Fax:  [phone]  Physical Therapy Treatment  Patient Details  Name: Allison [name] MRN: [id number] Date of Birth: 1961/01/25 Referring Provider: Toni Arthurs MD.  Encounter Date: 02/01/2017      PT End of Session - 02/01/17 1718    Visit Number 7   Number of Visits 12   Date for PT Re-Evaluation 02/06/17   Authorization Type Went to MD 01-31-17 and said to cont 2x wk/ 4 weeks   PT Start Time 1645   PT Stop Time 1746   PT Time Calculation (min) 61 min   Activity Tolerance Patient tolerated treatment well   Behavior During Therapy [facility] for tasks assessed/performed      Past Medical History:  Diagnosis Date  . Hypercholesteremia   . Hypertension     Past Surgical History:  Procedure Laterality Date  . ABDOMINAL HYSTERECTOMY    . ADENOIDECTOMY    . CESAREAN SECTION     X 3  . COLONOSCOPY N/A 04/14/2013   Procedure: COLONOSCOPY;  Surgeon: Corbin Ade, MD;  [facility];  Service: Endoscopy;  Laterality: N/A;  2:15 PM-moved to [address] notified pt  . DILATION AND CURETTAGE OF UTERUS      There were no vitals filed for this visit.      Subjective Assessment - 02/01/17 1710    Subjective MD was pleased with status. He D/C the crutch and the Boot and said not to lift more than 10#s   Limitations Walking   How long can you walk comfortably? CAM on left WBAT with bilateral axillary crutuches.   Currently in Pain? Yes   Pain Score 2    Pain Location Ankle   Pain Orientation Left   Pain Descriptors / Indicators Tightness   Pain Type Surgical pain   Pain Onset More than a month ago                         [facility] Adult PT Treatment/Exercise - 02/01/17 0001      Ambulation/Gait   Ambulation/Gait Yes   Gait Pattern Step-through pattern;Decreased step length - right   Ambulation Surface Level;Indoor   Gait Comments No  boot today. Worked on heel -toe gait pattern and decreasing Hip ER. t     Exercises   Exercises Ankle     Modalities   Modalities Electrical Stimulation;Vasopneumatic     Electrical Stimulation   Electrical Stimulation Location Left ankle. IFC 1-10hz  x 15 mins   Electrical Stimulation Goals Edema;Pain     Vasopneumatic   Number Minutes Vasopneumatic  15 minutes   Vasopnuematic Location  Ankle   Vasopneumatic Pressure Medium   Vasopneumatic Temperature  34     Manual Therapy   Manual Therapy Passive ROM   Passive ROM PROM to left ankle in supine all directions and also included scar mobilization.     Ankle Exercises: Aerobic   Stationary Bike Nustep x 12 mins L2 for ROM     Ankle Exercises: Standing   Rocker Board 5 minutes  PF/DF and mild stretching     Ankle Exercises: Seated   Other Seated Ankle Exercises Rockerboard , Inv/Ev x5 min,     dyna disc x 4 mins   Other Seated Ankle Exercises yellow tband 3x10 PF and eversion  PT Long Term Goals - 01/09/17 1256      PT LONG TERM GOAL #1   Title Independent with a HEP.   Time 4   Period Weeks   Status New     PT LONG TERM GOAL #2   Title Increase left ankle dorsiflexion to 6- 8 degrees to normalize the patient's gait pattern.   Time 4   Period Weeks   Status New     PT LONG TERM GOAL #3   Title Increase left ankle strength to 4+ to 5/5 to increase stability for functional tasks.   Time 4   Period Weeks   Status New     PT LONG TERM GOAL #4   Title Walk without deviation in clinic 500 feet.   Time 4   Period Weeks   Status New     PT LONG TERM GOAL #5   Title Perform ADL's with pain not > 2/10.   Period Weeks   Status New               Plan - 02/01/17 1749    Clinical Impression Statement Pt arrived today after MD F/U yesterday. MD weaned Pt from boot now WBAT. Rx focused on ROM gait and proprioception. Pt did fairly well with ambulation after practicing in clinic.  Decreased stride length in LT due to ROM deficit in LT ankle   Rehab Potential Excellent   PT Frequency 3x / week   PT Duration 4 weeks   PT Treatment/Interventions ADLs/Self Care Home Management;Cryotherapy;Electrical Stimulation;Moist Heat;Patient/family education;Therapeutic exercise;Therapeutic activities;Neuromuscular re-education;Manual techniques;Vasopneumatic Device   PT Next Visit Plan Cont x 4 weeks 2x wk as per Pt. Need MD note Faxed   Consulted and Agree with Plan of Care Patient      Patient will benefit from skilled therapeutic intervention in order to improve the following deficits and impairments:  Abnormal gait, Decreased activity tolerance, Pain, Increased edema, Decreased range of motion  Visit Diagnosis: Pain in left ankle and joints of left foot  Stiffness of left ankle, not elsewhere classified     Problem List Patient Active Problem List   Diagnosis Date Noted  . Post hysterectomy menopause 10/11/2016    Jameal Razzano,CHRIS, PTA 02/01/2017, 5:56 PM  [facility] [address] [place], [place], [address] Phone: [phone]   Fax:  [phone]  Name: Allison [name] MRN: [id number] Date of Birth: 08/28/1960

## 2017-02-06 ENCOUNTER — Ambulatory Visit: Payer: Worker's Compensation | Attending: Orthopedic Surgery | Admitting: *Deleted

## 2017-02-06 DIAGNOSIS — M25672 Stiffness of left ankle, not elsewhere classified: Secondary | ICD-10-CM

## 2017-02-06 DIAGNOSIS — M25572 Pain in left ankle and joints of left foot: Secondary | ICD-10-CM | POA: Diagnosis present

## 2017-02-06 NOTE — Therapy (Signed)
[facility] [address] [place], [place], [address] Phone: [phone]   Fax:  [phone]  Physical Therapy Treatment  Patient Details  Name: Allison [name] MRN: [id number] Date of Birth: 08-25-1960 Referring Provider: Toni Arthurs MD.  Encounter Date: 02/06/2017      PT End of Session - 02/06/17 1659    Visit Number 8   Number of Visits 12   Date for PT Re-Evaluation 02/06/17   Authorization Type Went to MD 01-31-17 and said to cont 2x wk/ 4 weeks   PT Start Time 1647   PT Stop Time 1742   PT Time Calculation (min) 55 min   Activity Tolerance Patient tolerated treatment well   Behavior During Therapy [facility] for tasks assessed/performed      Past Medical History:  Diagnosis Date  . Hypercholesteremia   . Hypertension     Past Surgical History:  Procedure Laterality Date  . ABDOMINAL HYSTERECTOMY    . ADENOIDECTOMY    . CESAREAN SECTION     X 3  . COLONOSCOPY N/A 04/14/2013   Procedure: COLONOSCOPY;  Surgeon: Corbin Ade, MD;  [facility];  Service: Endoscopy;  Laterality: N/A;  2:15 PM-moved to [address] notified pt  . DILATION AND CURETTAGE OF UTERUS      There were no vitals filed for this visit.      Subjective Assessment - 02/06/17 1657    Subjective MD was pleased with status. He D/C the crutch and the Boot and said not to lift more than 10#s   Limitations Walking   How long can you walk comfortably? CAM on left WBAT with bilateral axillary crutuches.   Patient Stated Goals Get back to normal.   Currently in Pain? Yes   Pain Score 2    Pain Location Ankle   Pain Orientation Left   Pain Type Surgical pain   Pain Onset More than a month ago                         [facility] Adult PT Treatment/Exercise - 02/06/17 0001      Ambulation/Gait   Gait Pattern Step-through pattern;Decreased step length - right   Ambulation Surface Level;Indoor   Gait Comments Worked on heel -toe gait  pattern and decreasing Hip ER.      Exercises   Exercises Ankle     Modalities   Modalities Electrical Stimulation;Vasopneumatic     Electrical Stimulation   Electrical Stimulation Location Left ankle. IFC 1-10hz  x 15 mins   Electrical Stimulation Goals Edema;Pain     Vasopneumatic   Number Minutes Vasopneumatic  15 minutes   Vasopnuematic Location  Ankle   Vasopneumatic Pressure Medium   Vasopneumatic Temperature  34     Manual Therapy   Manual Therapy Passive ROM   Passive ROM PROM to left ankle in supine all directions and also included scar mobilization.     Ankle Exercises: Aerobic   Stationary Bike Nustep x 12 mins L2 for ROM     Ankle Exercises: Standing   Rocker Board 5 minutes  PF/DF and mild stretching   Other Standing Ankle Exercises Dyna-disc x 3 mins circles each way     Ankle Exercises: Seated   Other Seated Ankle Exercises , Inv/Ev x5 min,     dyna disc x 4 mins  PT Long Term Goals - 01/09/17 1256      PT LONG TERM GOAL #1   Title Independent with a HEP.   Time 4   Period Weeks   Status New     PT LONG TERM GOAL #2   Title Increase left ankle dorsiflexion to 6- 8 degrees to normalize the patient's gait pattern.   Time 4   Period Weeks   Status New     PT LONG TERM GOAL #3   Title Increase left ankle strength to 4+ to 5/5 to increase stability for functional tasks.   Time 4   Period Weeks   Status New     PT LONG TERM GOAL #4   Title Walk without deviation in clinic 500 feet.   Time 4   Period Weeks   Status New     PT LONG TERM GOAL #5   Title Perform ADL's with pain not > 2/10.   Period Weeks   Status New               Plan - 02/06/17 1729    Clinical Impression Statement Pt arrived today still  walking with antalgic gait pattern and shortened stridelength due to DF ROM deficit. She was able to perform all therex today for LT ankle with minimal discomfort and mainly tightness. She did better  with proprioception exs with improved control. and improved DF to 2 degrees   Clinical Presentation Stable   Clinical Decision Making Low   Rehab Potential Excellent   PT Frequency 3x / week   PT Duration 4 weeks   PT Treatment/Interventions ADLs/Self Care Home Management;Cryotherapy;Electrical Stimulation;Moist Heat;Patient/family education;Therapeutic exercise;Therapeutic activities;Neuromuscular re-education;Manual techniques;Vasopneumatic Device   PT Next Visit Plan Cont x 4 weeks 2x wk as per Pt. Need MD note Faxed   Consulted and Agree with Plan of Care Patient      Patient will benefit from skilled therapeutic intervention in order to improve the following deficits and impairments:  Abnormal gait, Decreased activity tolerance, Pain, Increased edema, Decreased range of motion  Visit Diagnosis: Pain in left ankle and joints of left foot  Stiffness of left ankle, not elsewhere classified     Problem List Patient Active Problem List   Diagnosis Date Noted  . Post hysterectomy menopause 10/11/2016    Rachelle Edwards,CHRIS, PTA 02/06/2017, 5:43 PM  [facility] [address] [place], [place], [address] Phone: [phone]   Fax:  [phone]  Name: Allison [name] MRN: [id number] Date of Birth: [birth year]

## 2017-02-08 ENCOUNTER — Ambulatory Visit: Payer: Worker's Compensation | Admitting: *Deleted

## 2017-02-08 DIAGNOSIS — M25572 Pain in left ankle and joints of left foot: Secondary | ICD-10-CM

## 2017-02-08 DIAGNOSIS — M25672 Stiffness of left ankle, not elsewhere classified: Secondary | ICD-10-CM

## 2017-02-08 NOTE — Therapy (Signed)
[facility] [address] [place], [place], [address] Phone: [phone]   Fax:  [phone]  Physical Therapy Treatment  Patient Details  Name: Allison [name] MRN: [id number] Date of Birth: 1960-06-14 Referring Provider: Toni Arthurs MD.  Encounter Date: 02/08/2017      PT End of Session - 02/08/17 1714    Visit Number 9   Number of Visits 12   Date for PT Re-Evaluation 02/06/17   Authorization Type Went to MD 01-31-17 and said to cont 2x wk/ 4 weeks   PT Start Time 1645   PT Stop Time 1745   PT Time Calculation (min) 60 min      Past Medical History:  Diagnosis Date  . Hypercholesteremia   . Hypertension     Past Surgical History:  Procedure Laterality Date  . ABDOMINAL HYSTERECTOMY    . ADENOIDECTOMY    . CESAREAN SECTION     X 3  . COLONOSCOPY N/A 04/14/2013   Procedure: COLONOSCOPY;  Surgeon: Corbin Ade, MD;  [facility];  Service: Endoscopy;  Laterality: N/A;  2:15 PM-moved to [address] notified pt  . DILATION AND CURETTAGE OF UTERUS      There were no vitals filed for this visit.      Subjective Assessment - 02/08/17 1710    Subjective MD was pleased with status. He D/C the crutch and the Boot and said not to lift more than 10#s   Limitations Walking   Patient Stated Goals Get back to normal.   Currently in Pain? Yes   Pain Score 2    Pain Location Ankle   Pain Orientation Left   Pain Descriptors / Indicators Tightness   Pain Type Surgical pain   Pain Onset More than a month ago   Pain Frequency Intermittent                         [facility] Adult PT Treatment/Exercise - 02/08/17 0001      Exercises   Exercises Ankle     Modalities   Modalities Electrical Stimulation;Vasopneumatic     Electrical Stimulation   Electrical Stimulation Location Left ankle. IFC 1-10hz  x 15 mins   Electrical Stimulation Goals Edema;Pain     Vasopneumatic   Number Minutes Vasopneumatic   15 minutes   Vasopnuematic Location  Ankle   Vasopneumatic Pressure Medium   Vasopneumatic Temperature  34     Manual Therapy   Manual Therapy Passive ROM   Passive ROM PROM to left ankle in supine all directions and also included scar mobilization.     Ankle Exercises: Aerobic   Stationary Bike Bike L2 x 10 mins     Ankle Exercises: Standing   SLS on LT with RT toe touch UEs PRN    Rocker Board 5 minutes  PF/DF and mild stretching   Other Standing Ankle Exercises Dyna-disc x 3 mins circles each way     Ankle Exercises: Seated   Other Seated Ankle Exercises , Inv/Ev x5 min,     dyna disc x 4 mins                     PT Long Term Goals - 01/09/17 1256      PT LONG TERM GOAL #1   Title Independent with a HEP.   Time 4   Period Weeks   Status New     PT LONG TERM GOAL #2  Title Increase left ankle dorsiflexion to 6- 8 degrees to normalize the patient's gait pattern.   Time 4   Period Weeks   Status New     PT LONG TERM GOAL #3   Title Increase left ankle strength to 4+ to 5/5 to increase stability for functional tasks.   Time 4   Period Weeks   Status New     PT LONG TERM GOAL #4   Title Walk without deviation in clinic 500 feet.   Time 4   Period Weeks   Status New     PT LONG TERM GOAL #5   Title Perform ADL's with pain not > 2/10.   Period Weeks   Status New               Plan - 02/08/17 1750    Clinical Impression Statement Pt arrived today doing fairly well with mainly tightnesss and soreness. She had increased soreness after last Rx, but feels like she is doing better. She was able to complete all therex again for LT foot with SLS being the most challenging. Her ROM was DF 4 degrees,eversion and inversion to 15 degrees and PF to 40 degrees. normal response to modalities   Clinical Presentation Stable   Clinical Decision Making Low   Rehab Potential Excellent   PT Frequency 3x / week   PT Duration 4 weeks   PT  Treatment/Interventions ADLs/Self Care Home Management;Cryotherapy;Electrical Stimulation;Moist Heat;Patient/family education;Therapeutic exercise;Therapeutic activities;Neuromuscular re-education;Manual techniques;Vasopneumatic Device   PT Next Visit Plan Cont x 4 weeks 2x wk as per Pt. Need MD note Faxed   Consulted and Agree with Plan of Care Patient      Patient will benefit from skilled therapeutic intervention in order to improve the following deficits and impairments:  Abnormal gait, Decreased activity tolerance, Pain, Increased edema, Decreased range of motion  Visit Diagnosis: Pain in left ankle and joints of left foot  Stiffness of left ankle, not elsewhere classified     Problem List Patient Active Problem List   Diagnosis Date Noted  . Post hysterectomy menopause 10/11/2016    Adaiah Jaskot,CHRIS, PTA 02/08/2017, 5:59 PM  [facility] [address] [place], [place], [address] Phone: [phone]   Fax:  [phone]  Name: KEYUNA CUTHRELL MRN: [id number] Date of Birth: 16-Oct-1960

## 2017-02-13 ENCOUNTER — Ambulatory Visit: Payer: Worker's Compensation | Admitting: *Deleted

## 2017-02-13 DIAGNOSIS — M25572 Pain in left ankle and joints of left foot: Secondary | ICD-10-CM

## 2017-02-13 DIAGNOSIS — M25672 Stiffness of left ankle, not elsewhere classified: Secondary | ICD-10-CM

## 2017-02-13 NOTE — Therapy (Signed)
[facility] [address] [place], [place], [address] Phone: [phone]   Fax:  [phone]  Physical Therapy Treatment  Patient Details  Name: Allison [name] MRN: [id number] Date of Birth: May 26, 1960 Referring Provider: Toni Arthurs MD.  Encounter Date: 02/13/2017      PT End of Session - 02/13/17 1753    Visit Number 10   Number of Visits 12   Date for PT Re-Evaluation 02/06/17   Authorization Type Went to MD 01-31-17 and said to cont 2x wk/ 4 weeks   PT Start Time 1645   PT Stop Time 1744   PT Time Calculation (min) 59 min   Activity Tolerance Patient tolerated treatment well   Behavior During Therapy [facility] for tasks assessed/performed      Past Medical History:  Diagnosis Date  . Hypercholesteremia   . Hypertension     Past Surgical History:  Procedure Laterality Date  . ABDOMINAL HYSTERECTOMY    . ADENOIDECTOMY    . CESAREAN SECTION     X 3  . COLONOSCOPY N/A 04/14/2013   Procedure: COLONOSCOPY;  Surgeon: Corbin Ade, MD;  [facility];  Service: Endoscopy;  Laterality: N/A;  2:15 PM-moved to [address] notified pt  . DILATION AND CURETTAGE OF UTERUS      There were no vitals filed for this visit.      Subjective Assessment - 02/13/17 1702    Subjective LT ankle stays sore   Limitations Walking   How long can you walk comfortably? CAM on left WBAT with bilateral axillary crutuches.   Patient Stated Goals Get back to normal.   Currently in Pain? Yes   Pain Score 2    Pain Location Ankle   Pain Orientation Left   Pain Type Surgical pain   Pain Onset More than a month ago                         [facility] Adult PT Treatment/Exercise - 02/13/17 0001      Exercises   Exercises Ankle     Modalities   Modalities Electrical Stimulation;Vasopneumatic     Electrical Stimulation   Electrical Stimulation Location Left ankle. IFC 1-10hz  x 15 mins   Electrical Stimulation Goals  Edema;Pain     Vasopneumatic   Number Minutes Vasopneumatic  15 minutes   Vasopnuematic Location  Ankle   Vasopneumatic Pressure Medium   Vasopneumatic Temperature  34     Manual Therapy   Manual Therapy Passive ROM   Passive ROM PROM to left ankle in supine all directions and also included scar mobilization.     Ankle Exercises: Aerobic   Stationary Bike Nustep x 10 mins L2 for ROM     Ankle Exercises: Standing   SLS on LT with RT toe touch UEs PRN    Rocker Board 5 minutes  PF/DF and mild stretching, inv/Ev x 3 mins   Other Standing Ankle Exercises Dyna-disc x 3 mins circles each way     Ankle Exercises: Seated   Other Seated Ankle Exercises Yellow tband DF, eversion, and inversion 2x10 eachway                     PT Long Term Goals - 01/09/17 1256      PT LONG TERM GOAL #1   Title Independent with a HEP.   Time 4   Period Weeks   Status New  PT LONG TERM GOAL #2   Title Increase left ankle dorsiflexion to 6- 8 degrees to normalize the patient's gait pattern.   Time 4   Period Weeks   Status New     PT LONG TERM GOAL #3   Title Increase left ankle strength to 4+ to 5/5 to increase stability for functional tasks.   Time 4   Period Weeks   Status New     PT LONG TERM GOAL #4   Title Walk without deviation in clinic 500 feet.   Time 4   Period Weeks   Status New     PT LONG TERM GOAL #5   Title Perform ADL's with pain not > 2/10.   Period Weeks   Status New             Patient will benefit from skilled therapeutic intervention in order to improve the following deficits and impairments:     Visit Diagnosis: Pain in left ankle and joints of left foot  Stiffness of left ankle, not elsewhere classified     Problem List Patient Active Problem List   Diagnosis Date Noted  . Post hysterectomy menopause 10/11/2016    RAMSEUR,CHRIS, PTA 02/13/2017, 5:54 PM  [facility] [address] [place], [place], [address] Phone: [phone]   Fax:  [phone]  Name: Allison [name] MRN: [id number] Date of Birth: Jun 03, 1960

## 2017-02-15 ENCOUNTER — Encounter: Payer: Self-pay | Admitting: *Deleted

## 2017-02-19 NOTE — Addendum Note (Signed)
Addended by: Bethene Hankinson, [place] W on: 02/19/2017 01:33 PM   Modules accepted: Orders

## 2017-02-20 ENCOUNTER — Ambulatory Visit: Payer: Worker's Compensation | Admitting: *Deleted

## 2017-02-20 DIAGNOSIS — M25572 Pain in left ankle and joints of left foot: Secondary | ICD-10-CM

## 2017-02-20 DIAGNOSIS — M25672 Stiffness of left ankle, not elsewhere classified: Secondary | ICD-10-CM

## 2017-02-20 NOTE — Therapy (Signed)
[facility] [address] [place], [place], [address] Phone: [phone]   Fax:  [phone]  Physical Therapy Treatment  Patient Details  Name: Allison [name] MRN: [id number] Date of Birth: October 12, 1960 Referring Provider: Toni Arthurs MD.  Encounter Date: 02/20/2017      PT End of Session - 02/20/17 1702    Visit Number 11   Number of Visits 18   Date for PT Re-Evaluation 03/06/17   Authorization Type Went to MD 01-31-17 and said to cont 2x wk/ 4 weeks   PT Start Time 1645   PT Stop Time 1740   PT Time Calculation (min) 55 min      Past Medical History:  Diagnosis Date  . Hypercholesteremia   . Hypertension     Past Surgical History:  Procedure Laterality Date  . ABDOMINAL HYSTERECTOMY    . ADENOIDECTOMY    . CESAREAN SECTION     X 3  . COLONOSCOPY N/A 04/14/2013   Procedure: COLONOSCOPY;  Surgeon: Corbin Ade, MD;  [facility];  Service: Endoscopy;  Laterality: N/A;  2:15 PM-moved to [address] notified pt  . DILATION AND CURETTAGE OF UTERUS      There were no vitals filed for this visit.      Subjective Assessment - 02/20/17 1701    Subjective LT ankle stays sore, but it's doing better. walking is getting easier   Limitations Walking   Patient Stated Goals Get back to normal.   Currently in Pain? Yes   Pain Score 2    Pain Location Ankle   Pain Descriptors / Indicators Tightness   Pain Type Surgical pain   Pain Onset More than a month ago                         [facility] Adult PT Treatment/Exercise - 02/20/17 0001      Exercises   Exercises Ankle     Modalities   Modalities Electrical Stimulation;Vasopneumatic     Electrical Stimulation   Electrical Stimulation Location Left ankle. IFC 1-10hz  x 15 mins   Electrical Stimulation Goals Edema;Pain     Vasopneumatic   Number Minutes Vasopneumatic  15 minutes   Vasopnuematic Location  Ankle   Vasopneumatic Pressure Medium   Vasopneumatic Temperature  34     Manual Therapy   Manual Therapy Passive ROM   Passive ROM PROM to left ankle in supine all directions and also included scar mobilization.     Ankle Exercises: Aerobic   Stationary Bike Nustep x 12 mins L2 for ROM     Ankle Exercises: Standing   SLS on LT with RT toe touch on floor and then on balance pad   Rocker Board 5 minutes  PF/DF and mild stretching, inv/Ev x 3 mins   Other Standing Ankle Exercises Dyna-disc x 3 mins circles each way                     PT Long Term Goals - 01/09/17 1256      PT LONG TERM GOAL #1   Title Independent with a HEP.   Time 4   Period Weeks   Status New     PT LONG TERM GOAL #2   Title Increase left ankle dorsiflexion to 6- 8 degrees to normalize the patient's gait pattern.   Time 4   Period Weeks   Status New     PT LONG TERM  GOAL #3   Title Increase left ankle strength to 4+ to 5/5 to increase stability for functional tasks.   Time 4   Period Weeks   Status New     PT LONG TERM GOAL #4   Title Walk without deviation in clinic 500 feet.   Time 4   Period Weeks   Status New     PT LONG TERM GOAL #5   Title Perform ADL's with pain not > 2/10.   Period Weeks   Status New               Plan - 02/20/17 1730    Clinical Impression Statement Pt arrived today with improved gait cycle. She has a mild antalgic patttern with a decreased step length on RT side and increased hip ER on LT. She was able to perform therex with less pain today and improved proprioception on balance act.'s.   Rehab Potential Excellent   PT Frequency 3x / week   PT Duration 4 weeks   PT Treatment/Interventions ADLs/Self Care Home Management;Cryotherapy;Electrical Stimulation;Moist Heat;Patient/family education;Therapeutic exercise;Therapeutic activities;Neuromuscular re-education;Manual techniques;Vasopneumatic Device   PT Next Visit Plan Cont x 4 weeks 2x wk as per Pt. Need MD note Faxed   Consulted and  Agree with Plan of Care Patient      Patient will benefit from skilled therapeutic intervention in order to improve the following deficits and impairments:  Abnormal gait, Decreased activity tolerance, Pain, Increased edema, Decreased range of motion  Visit Diagnosis: Pain in left ankle and joints of left foot  Stiffness of left ankle, not elsewhere classified     Problem List Patient Active Problem List   Diagnosis Date Noted  . Post hysterectomy menopause 10/11/2016    Allison [name],CHRIS , PTA 02/20/2017, 5:46 PM  [facility] [address] [place], [place], [address] Phone: [phone]   Fax:  [phone]  Name: Allison [name] MRN: [id number] Date of Birth: 1960/06/24

## 2017-02-22 ENCOUNTER — Ambulatory Visit: Payer: Worker's Compensation | Admitting: *Deleted

## 2017-02-22 DIAGNOSIS — M25572 Pain in left ankle and joints of left foot: Secondary | ICD-10-CM | POA: Diagnosis not present

## 2017-02-22 DIAGNOSIS — M25672 Stiffness of left ankle, not elsewhere classified: Secondary | ICD-10-CM

## 2017-02-22 NOTE — Therapy (Signed)
[facility] [address] [place], [place], [address] Phone: [phone]   Fax:  [phone]  Physical Therapy Treatment  Patient Details  Name: Allison [name] MRN: [id number] Date of Birth: Nov 15, 1960 Referring Provider: Toni ArthursJohn Hewitt MD.  Encounter Date: 02/22/2017      PT End of Session - 02/22/17 1657    Visit Number 12   Number of Visits 18   Date for PT Re-Evaluation 03/06/17   Authorization Type Went to MD 01-31-17 and said to cont 2x wk/ 4 weeks   PT Start Time 1645   PT Stop Time 1739   PT Time Calculation (min) 54 min      Past Medical History:  Diagnosis Date  . Hypercholesteremia   . Hypertension     Past Surgical History:  Procedure Laterality Date  . ABDOMINAL HYSTERECTOMY    . ADENOIDECTOMY    . CESAREAN SECTION     X 3  . COLONOSCOPY N/A 04/14/2013   Procedure: COLONOSCOPY;  Surgeon: Corbin Adeobert M Rourk, MD;  [facility];  Service: Endoscopy;  Laterality: N/A;  2:15 PM-moved to [address] notified pt  . DILATION AND CURETTAGE OF UTERUS      There were no vitals filed for this visit.      Subjective Assessment - 02/22/17 1656    Subjective LT ankle stays sore, but it's doing better. walking is getting easier   Limitations Walking   How long can you walk comfortably? CAM on left WBAT with bilateral axillary crutuches.   Patient Stated Goals Get back to normal.   Currently in Pain? Yes   Pain Score 2    Pain Location Ankle   Pain Orientation Left   Pain Descriptors / Indicators Tightness   Pain Type Surgical pain   Pain Onset More than a month ago   Pain Frequency Intermittent                         [facility] Adult PT Treatment/Exercise - 02/22/17 0001      Exercises   Exercises Ankle     Modalities   Modalities Electrical Stimulation;Vasopneumatic     Electrical Stimulation   Electrical Stimulation Location Left ankle. IFC 1-10hz  x 15 mins   Electrical Stimulation  Goals Edema;Pain     Vasopneumatic   Number Minutes Vasopneumatic  15 minutes   Vasopnuematic Location  Ankle   Vasopneumatic Pressure Medium   Vasopneumatic Temperature  34     Manual Therapy   Manual Therapy Passive ROM   Passive ROM PROM to left ankle in supine all directions and also included scar mobilization.      Ankle Exercises: Aerobic   Stationary Bike Nustep x 12 mins L2 for ROM     Ankle Exercises: Standing   SLS on LT with RT toe touch on floor and then on balance pad   Rocker Board 5 minutes  PF/DF and mild stretching, inv/Ev x 3 mins   Other Standing Ankle Exercises Dyna-disc x 3 mins circles each way                     PT Long Term Goals - 01/09/17 1256      PT LONG TERM GOAL #1   Title Independent with a HEP.   Time 4   Period Weeks   Status New     PT LONG TERM GOAL #2   Title Increase left ankle dorsiflexion to  6- 8 degrees to normalize the patient's gait pattern.   Time 4   Period Weeks   Status New     PT LONG TERM GOAL #3   Title Increase left ankle strength to 4+ to 5/5 to increase stability for functional tasks.   Time 4   Period Weeks   Status New     PT LONG TERM GOAL #4   Title Walk without deviation in clinic 500 feet.   Time 4   Period Weeks   Status New     PT LONG TERM GOAL #5   Title Perform ADL's with pain not > 2/10.   Period Weeks   Status New               Plan - 02/22/17 1737    Clinical Impression Statement Pt arrived today doing fairly well with low pain levels in LT ankle. Her gait continues to improve with increased stride length and less pain. She was able to complete all therex and balance act.'s with minimal increase in pain. Her ROM today was Inversion 16  degrees and EV 18 degrees, DF 5 degrees.. Balance act's are still very challenging for Pt due to deficits.      Patient will benefit from skilled therapeutic intervention in order to improve the following deficits and impairments:      Visit Diagnosis: Pain in left ankle and joints of left foot  Stiffness of left ankle, not elsewhere classified     Problem List Patient Active Problem List   Diagnosis Date Noted  . Post hysterectomy menopause 10/11/2016    RAMSEUR,CHRIS, PTA 02/22/2017, 5:45 PM  [facility] [address] [place], [place], [address] Phone: [phone]   Fax:  [phone]  Name: Allison [name] MRN: [id number] Date of Birth: 25-Feb-1961

## 2017-02-27 ENCOUNTER — Ambulatory Visit: Payer: Worker's Compensation | Admitting: Physical Therapy

## 2017-02-27 ENCOUNTER — Encounter: Payer: Self-pay | Admitting: Physical Therapy

## 2017-02-27 DIAGNOSIS — M25572 Pain in left ankle and joints of left foot: Secondary | ICD-10-CM | POA: Diagnosis not present

## 2017-02-27 DIAGNOSIS — M25672 Stiffness of left ankle, not elsewhere classified: Secondary | ICD-10-CM

## 2017-02-27 NOTE — Therapy (Signed)
[facility] [address] [place], [place], [address] Phone: [phone]   Fax:  [phone]  Physical Therapy Treatment  Patient Details  Name: Allison [name] MRN: [id number] Date of Birth: 09-Nov-1960 Referring Provider: Toni ArthursJohn Hewitt MD.  Encounter Date: 02/27/2017      PT End of Session - 02/27/17 1619    Visit Number 13   Number of Visits 18   Date for PT Re-Evaluation 03/06/17   Authorization Type Went to MD 01-31-17 and said to cont 2x wk/ 4 weeks   PT Start Time 0317   PT Stop Time 0412   PT Time Calculation (min) 55 min   Activity Tolerance Patient tolerated treatment well   Behavior [facility] tasks assessed/performed      Past Medical History:  Diagnosis Date  . Hypercholesteremia   . Hypertension     Past Surgical History:  Procedure Laterality Date  . ABDOMINAL HYSTERECTOMY    . ADENOIDECTOMY    . CESAREAN SECTION     X 3  . COLONOSCOPY N/A 04/14/2013   Procedure: COLONOSCOPY;  Surgeon: Corbin Adeobert M Rourk, MD;  [facility];  Service: Endoscopy;  Laterality: N/A;  2:15 PM-moved to [address] notified pt  . DILATION AND CURETTAGE OF UTERUS      There were no vitals filed for this visit.      Subjective Assessment - 02/27/17 1621    Subjective The ankle stays sore.  I got compression socks.   Pain Score 2    Pain Location Ankle   Pain Orientation Left   Pain Descriptors / Indicators Sore   Pain Type Surgical pain   Pain Onset More than a month ago                         [facility] - 02/27/17 0001      Exercises   Exercises Ankle     Electrical Stimulation   Electrical Stimulation Location --  Left ankle.   Electrical Stimulation Action IFC x 15 minutes at 1-10 Hz.     Vasopneumatic   Number Minutes Vasopneumatic  15 minutes   Vasopnuematic Location  --  Left ankle.   Vasopneumatic Pressure Medium     Manual Therapy   Manual  Therapy Passive ROM   Passive ROM PROM to patient's left ankle in all motions x 13 minutes.     Ankle Exercises: Aerobic   Stationary Bike Nustep level 6 x 15 minutes.     Ankle Exercises: Standing   Rocker Board Limitations 5 minutes.   Other Standing Ankle Exercises Dynadisc with WBAT in parallel bars x 3 minutes.                     PT Long Term Goals - 01/09/17 1256      PT LONG TERM GOAL #1   Title Independent with a HEP.   Time 4   Period Weeks   Status New     PT LONG TERM GOAL #2   Title Increase left ankle dorsiflexion to 6- 8 degrees to normalize the patient's gait pattern.   Time 4   Period Weeks   Status New     PT LONG TERM GOAL #3   Title Increase left ankle strength to 4+ to 5/5 to increase stability for functional tasks.   Time 4   Period Weeks   Status New  PT LONG TERM GOAL #4   Title Walk without deviation in clinic 500 feet.   Time 4   Period Weeks   Status New     PT LONG TERM GOAL #5   Title Perform ADL's with pain not > 2/10.   Period Weeks   Status New               Plan - 02/27/17 1645    Clinical Impression Statement Excellent job today.  Improved gait following treatment.   PT Treatment/Interventions ADLs/Self Care Home Management;Cryotherapy;Electrical Stimulation;Moist Heat;Patient/family education;Therapeutic exercise;Therapeutic activities;Neuromuscular re-education;Manual techniques;Vasopneumatic Device      Patient will benefit from skilled therapeutic intervention in order to improve the following deficits and impairments:  Abnormal gait, Decreased activity tolerance, Pain, Increased edema, Decreased range of motion  Visit Diagnosis: Pain in left ankle and joints of left foot  Stiffness of left ankle, not elsewhere classified     Problem List Patient Active Problem List   Diagnosis Date Noted  . Post hysterectomy menopause 10/11/2016    Kelina Beauchamp, [place] MPT 02/27/2017, 5:18 PM  [facility] [address] [place], [place], [address] Phone: [phone]   Fax:  [phone]  Name: Allison [name] MRN: [id number] Date of Birth: Sep 27, 1960

## 2017-03-01 ENCOUNTER — Ambulatory Visit: Payer: Worker's Compensation | Admitting: *Deleted

## 2017-03-01 DIAGNOSIS — M25572 Pain in left ankle and joints of left foot: Secondary | ICD-10-CM | POA: Diagnosis not present

## 2017-03-01 DIAGNOSIS — M25672 Stiffness of left ankle, not elsewhere classified: Secondary | ICD-10-CM

## 2017-03-01 NOTE — Therapy (Signed)
[facility] [address] [place], [place], [address] Phone: [phone]   Fax:  [phone]  Physical Therapy Treatment  Patient Details  Name: Allison [name] MRN: [id number] Date of Birth: [birth year] Referring Provider: Toni ArthursJohn Hewitt MD.  Encounter Date: 03/01/2017      PT End of Session - 03/01/17 1807    Visit Number 14   Number of Visits 18   Date for PT Re-Evaluation 03/06/17   Authorization Type Went to MD 01-31-17 and said to cont 2x wk/ 4 weeks   PT Start Time 1645   PT Stop Time 1745   PT Time Calculation (min) 60 min      Past Medical History:  Diagnosis Date  . Hypercholesteremia   . Hypertension     Past Surgical History:  Procedure Laterality Date  . ABDOMINAL HYSTERECTOMY    . ADENOIDECTOMY    . CESAREAN SECTION     X 3  . COLONOSCOPY N/A 04/14/2013   Procedure: COLONOSCOPY;  Surgeon: Corbin Adeobert M Rourk, MD;  [facility];  Service: Endoscopy;  Laterality: N/A;  2:15 PM-moved to [address] notified pt  . DILATION AND CURETTAGE OF UTERUS      There were no vitals filed for this visit.      Subjective Assessment - 03/01/17 1806    Subjective The ankle stays sore.  I got compression socks.   Limitations Walking   How long can you walk comfortably? CAM on left WBAT with bilateral axillary crutuches.   Patient Stated Goals Get back to normal.                         [facility] - 03/01/17 0001      Exercises   Exercises Ankle     Modalities   Modalities Electrical Stimulation;Vasopneumatic     Electrical Stimulation   Electrical Stimulation Location Left ankle. IFC 1-10hz  x 15 mins   Electrical Stimulation Goals Edema;Pain     Vasopneumatic   Number Minutes Vasopneumatic  15 minutes   Vasopnuematic Location  Ankle   Vasopneumatic Pressure Medium   Vasopneumatic Temperature  34     Manual Therapy   Manual Therapy Passive ROM   Passive ROM PROM to  patient's left ankle in all motions x 13 minutes.     Ankle Exercises: Aerobic   Stationary Bike Bike x 12 mins L1 seat 1   Elliptical L5 and ramp 5-10 x min     Ankle Exercises: Standing   SLS on LT with RT toe touch on floor and then on balance pad   Rocker Board 5 minutes  PF/DF and mild stretching, inv/Ev x 3 mins   Other Standing Ankle Exercises Dynadisc with WBAT in parallel bars x 3 minutes.   Other Standing Ankle Exercises inverted BOSU x 5 mins                     PT Long Term Goals - 01/09/17 1256      PT LONG TERM GOAL #1   Title Independent with a HEP.   Time 4   Period Weeks   Status New     PT LONG TERM GOAL #2   Title Increase left ankle dorsiflexion to 6- 8 degrees to normalize the patient's gait pattern.   Time 4   Period Weeks   Status New     PT LONG TERM GOAL #3   Title  Increase left ankle strength to 4+ to 5/5 to increase stability for functional tasks.   Time 4   Period Weeks   Status New     PT LONG TERM GOAL #4   Title Walk without deviation in clinic 500 feet.   Time 4   Period Weeks   Status New     PT LONG TERM GOAL #5   Title Perform ADL's with pain not > 2/10.   Period Weeks   Status New               Plan - 03/01/17 1809    Clinical Impression Statement Pt arrived today doing better with gait and only mild deviation due to DF ROM Deficit. Her ROM was DF 5 degrees and PF 40 degrees. She was able to perform all therex and balance act.'s with mainly fatigue and soreness in LT ankle. Proprioception is improving, but still with deficit. Her CC is still not being able to stand for long periods of  time due to soreness and fatigue .   Clinical Presentation Stable   Clinical Decision Making Low   Rehab Potential Excellent   PT Frequency 3x / week   PT Duration 4 weeks   PT Treatment/Interventions ADLs/Self Care Home Management;Cryotherapy;Electrical Stimulation;Moist Heat;Patient/family education;Therapeutic  exercise;Therapeutic activities;Neuromuscular re-education;Manual techniques;Vasopneumatic Device   PT Next Visit Plan To MD tomorrow. Continue as per MD recommendations   Consulted and Agree with Plan of Care Patient      Patient will benefit from skilled therapeutic intervention in order to improve the following deficits and impairments:  Abnormal gait, Decreased activity tolerance, Pain, Increased edema, Decreased range of motion  Visit Diagnosis: Pain in left ankle and joints of left foot  Stiffness of left ankle, not elsewhere classified     Problem List Patient Active Problem List   Diagnosis Date Noted  . Post hysterectomy menopause 10/11/2016   [facility] Ramseur PTA [place], [place], MPT 03/01/2017, 6:19 PM  [facility] [address] [place], [place], [address] Phone: [phone]   Fax:  [phone]  Name: Allison [name] MRN: [id number] Date of Birth: [birth year]

## 2017-03-13 ENCOUNTER — Ambulatory Visit: Payer: Worker's Compensation | Attending: Orthopedic Surgery | Admitting: Physical Therapy

## 2017-03-13 ENCOUNTER — Encounter: Payer: Self-pay | Admitting: Physical Therapy

## 2017-03-13 DIAGNOSIS — M25572 Pain in left ankle and joints of left foot: Secondary | ICD-10-CM | POA: Diagnosis present

## 2017-03-13 DIAGNOSIS — M25672 Stiffness of left ankle, not elsewhere classified: Secondary | ICD-10-CM | POA: Diagnosis present

## 2017-03-13 NOTE — Therapy (Signed)
[facility] [address] [place], [place], [address] Phone: [phone]   Fax:  [phone]  Physical Therapy Treatment  Patient Details  Name: Allison [name] MRN: [id number] Date of Birth: [birth year] Referring Provider: Toni ArthursJohn Hewitt MD.   Encounter Date: 03/13/2017  PT End of Session - 03/13/17 1650    Visit Number  15    Number of Visits  18    Date for PT Re-Evaluation  03/06/17    PT Start Time  1645    PT Stop Time  1744    PT Time Calculation (min)  59 min    Activity Tolerance  Patient tolerated treatment well    Behavior During Therapy  [facility] for tasks assessed/performed       Past Medical History:  Diagnosis Date  . Hypercholesteremia   . Hypertension     Past Surgical History:  Procedure Laterality Date  . ABDOMINAL HYSTERECTOMY    . ADENOIDECTOMY    . CESAREAN SECTION     X 3  . DILATION AND CURETTAGE OF UTERUS      There were no vitals filed for this visit.  Subjective Assessment - 03/13/17 1646    Subjective  Reports that she goes to MD but may be put back out on floor in december. Reports that she really feels the pain only with her stretches.    Limitations  Walking    How long can you walk comfortably?  CAM on left WBAT with bilateral axillary crutuches.    Patient Stated Goals  Get back to normal.    Currently in Pain?  No/denies                      [facility] Treatment/Exercise - 03/13/17 0001      Modalities   Modalities  Electrical Stimulation;Vasopneumatic      Programme researcher, broadcasting/film/videolectrical Stimulation   Electrical Stimulation Location  L ankle    Electrical Stimulation Action  IFC    Electrical Stimulation Parameters  1-10 hz x15 min    Electrical Stimulation Goals  Edema;Pain      Vasopneumatic   Number Minutes Vasopneumatic   15 minutes    Vasopnuematic Location   Ankle    Vasopneumatic Pressure  Medium    Vasopneumatic Temperature   34      Ankle Exercises: Aerobic   Stationary Bike   L2 x10 min      Ankle Exercises: Standing   SLS  LLE SLS with toe touches to balance pods x8 reps    Rocker Board  5 minutes x4 min for balance   x4 min for balance   Heel Raises  20 reps    Toe Raise  20 reps    Other Standing Ankle Exercises  L forward step up 6" step x20 reps    Other Standing Ankle Exercises  BOSU x3 min      Ankle Exercises: Seated   Other Seated Ankle Exercises  L ankle isolator 1# x20 reps PF,Env, Inv                   PT Long Term Goals - 01/09/17 1256      PT LONG TERM GOAL #1   Title  Independent with a HEP.    Time  4    Period  Weeks    Status  New      PT LONG TERM GOAL #2   Title  Increase left ankle dorsiflexion to  6- 8 degrees to normalize the patient's gait pattern.    Time  4    Period  Weeks    Status  New      PT LONG TERM GOAL #3   Title  Increase left ankle strength to 4+ to 5/5 to increase stability for functional tasks.    Time  4    Period  Weeks    Status  New      PT LONG TERM GOAL #4   Title  Walk without deviation in clinic 500 feet.    Time  4    Period  Weeks    Status  New      PT LONG TERM GOAL #5   Title  Perform ADL's with pain not > 2/10.    Period  Weeks    Status  New            Plan - 03/13/17 1732    Clinical Impression Statement  Patient tolerated today's treatment well with no reports of L ankle pain upon arrival. No complaints with standing or seated strengthening exercises. Minimal intability still noted with SLS on LLE. Patient required verbal and tactile cueing to avoid knee compensation with seated ankle isolator strengthening. Edema still present surrounding L ankle at this time. Normal modalities response noted following removal of the modalities.    Rehab Potential  Excellent    PT Frequency  3x / week    PT Duration  4 weeks    PT Treatment/Interventions  ADLs/Self Care Home Management;Cryotherapy;Electrical Stimulation;Moist Heat;Patient/family education;Therapeutic  exercise;Therapeutic activities;Neuromuscular re-education;Manual techniques;Vasopneumatic Device    PT Next Visit Plan  Continue with ankle strengthening per MPT POC.    Consulted and Agree with Plan of Care  Patient       Patient will benefit from skilled therapeutic intervention in order to improve the following deficits and impairments:  Abnormal gait, Decreased activity tolerance, Pain, Increased edema, Decreased range of motion  Visit Diagnosis: Pain in left ankle and joints of left foot  Stiffness of left ankle, not elsewhere classified     Problem List Patient Active Problem List   Diagnosis Date Noted  . Post hysterectomy menopause 10/11/2016    Evelene CroonKelsey M Parsons, PTA 03/13/2017, 5:54 PM  [facility] [address] [place], [place], [address] Phone: [phone]   Fax:  [phone]  Name: Allison [name] MRN: [id number] Date of Birth: 1960-07-03

## 2017-03-15 ENCOUNTER — Ambulatory Visit: Payer: Worker's Compensation | Admitting: Physical Therapy

## 2017-03-15 ENCOUNTER — Encounter: Payer: Self-pay | Admitting: Physical Therapy

## 2017-03-15 DIAGNOSIS — M25672 Stiffness of left ankle, not elsewhere classified: Secondary | ICD-10-CM

## 2017-03-15 DIAGNOSIS — M25572 Pain in left ankle and joints of left foot: Secondary | ICD-10-CM

## 2017-03-15 NOTE — Therapy (Signed)
[facility] [address] [place], [place], [address] Phone: [phone]   Fax:  [phone]  Physical Therapy Treatment  Patient Details  Name: Allison IshiharaVickey C [name] MRN: [id number] Date of Birth: Nov 02, 1960 Referring Provider: Toni ArthursJohn Hewitt MD.   Encounter Date: 03/15/2017  PT End of Session - 03/15/17 1645    Visit Number  16    Number of Visits  18    Date for PT Re-Evaluation  03/06/17    Authorization Type  Went to MD 01-31-17 and said to cont 2x wk/ 4 weeks    PT Start Time  1646    PT Stop Time  1738    PT Time Calculation (min)  52 min    Activity Tolerance  Patient tolerated treatment well    Behavior During Therapy  [facility] for tasks assessed/performed       Past Medical History:  Diagnosis Date  . Hypercholesteremia   . Hypertension     Past Surgical History:  Procedure Laterality Date  . ABDOMINAL HYSTERECTOMY    . ADENOIDECTOMY    . CESAREAN SECTION     X 3  . DILATION AND CURETTAGE OF UTERUS      There were no vitals filed for this visit.  Subjective Assessment - 03/15/17 1645    Limitations  Walking    How long can you walk comfortably?  CAM on left WBAT with bilateral axillary crutuches.    Patient Stated Goals  Get back to normal.         [facility] PT Assessment - 03/15/17 0001      Assessment   Medical Diagnosis  Trimalleolar fracture, closed.    Onset Date/Surgical Date  11/12/16    Next MD Visit  01/31/2017                  [facility]/Ex[facility] - 03/15/17 0001      Modalities   Modalities  Electrical Stimulation;Vasopneumatic      Electrical Stimulation   Electrical Stimulation Location  L ankle    Electrical Stimulation Action  Pre-Mod    Electrical Stimulation Parameters  80-150 hz x15 min    Electrical Stimulation Goals  Edema;Pain      Vasopneumatic   Number Minutes Vasopneumatic   15 minutes    Vasopnuematic Location   Ankle    Vasopneumatic Pressure  Medium    Vasopneumatic Temperature   34      Ankle Exercises: Aerobic   Stationary Bike  L2 x10 min    Elliptical  L5, R5 x10 min      Ankle Exercises: Standing   Rocker Board  5 minutes for stretch    Heel Raises  20 reps    Toe Raise  20 reps    Other Standing Ankle Exercises  L forward step up 6" step x20 reps      Ankle Exercises: Seated   Other Seated Ankle Exercises  L ankle isolator 1# x20 reps PF,Env, Inv                   PT Long Term Goals - 01/09/17 1256      PT LONG TERM GOAL #1   Title  Independent with a HEP.    Time  4    Period  Weeks    Status  New      PT LONG TERM GOAL #2   Title  Increase left ankle dorsiflexion to 6- 8 degrees to normalize the patient's  gait pattern.    Time  4    Period  Weeks    Status  New      PT LONG TERM GOAL #3   Title  Increase left ankle strength to 4+ to 5/5 to increase stability for functional tasks.    Time  4    Period  Weeks    Status  New      PT LONG TERM GOAL #4   Title  Walk without deviation in clinic 500 feet.    Time  4    Period  Weeks    Status  New      PT LONG TERM GOAL #5   Title  Perform ADL's with pain not > 2/10.    Period  Weeks    Status  New            Plan - 03/15/17 1740    Clinical Impression Statement  Patient tolerated today's treatment well with reports of minimal L ankle soreness upon arrival. Patient related soreness in L ankle to sitting for prolonged period today. Patient able to complete exercises today in standing and sitting without reports of any increased soreness or pain. Patient demonstrated improved form with seated L ankle isolator as she completed exercises with reduced L knee involvement. Normal modalities response noted following removal of the modalities.    Rehab Potential  Excellent    PT Frequency  3x / week    PT Duration  4 weeks    PT Treatment/Interventions  ADLs/Self Care Home Management;Cryotherapy;Electrical Stimulation;Moist Heat;Patient/family  education;Therapeutic exercise;Therapeutic activities;Neuromuscular re-education;Manual techniques;Vasopneumatic Device    PT Next Visit Plan  Continue with ankle strengthening per MPT POC.    Consulted and Agree with Plan of Care  Patient       Patient will benefit from skilled therapeutic intervention in order to improve the following deficits and impairments:  Abnormal gait, Decreased activity tolerance, Pain, Increased edema, Decreased range of motion  Visit Diagnosis: Pain in left ankle and joints of left foot  Stiffness of left ankle, not elsewhere classified     Problem List Patient Active Problem List   Diagnosis Date Noted  . Post hysterectomy menopause 10/11/2016    [place] CroonKelsey M Parsons, PTA 03/15/2017, 5:45 PM  [facility] [address] [place], [place], [address] Phone: [phone]   Fax:  [phone]  Name: Allison IshiharaVickey C [name] MRN: [id number] Date of Birth: [birth year]

## 2017-03-20 ENCOUNTER — Encounter: Payer: Self-pay | Admitting: Internal Medicine

## 2017-03-20 ENCOUNTER — Encounter: Payer: Self-pay | Admitting: Physical Therapy

## 2017-03-20 ENCOUNTER — Ambulatory Visit: Payer: Worker's Compensation | Admitting: Physical Therapy

## 2017-03-20 DIAGNOSIS — M25572 Pain in left ankle and joints of left foot: Secondary | ICD-10-CM | POA: Diagnosis not present

## 2017-03-20 DIAGNOSIS — M25672 Stiffness of left ankle, not elsewhere classified: Secondary | ICD-10-CM

## 2017-03-20 NOTE — Therapy (Signed)
[facility] [address] [place], [place], [address] Phone: [phone]   Fax:  [phone]  Physical Therapy Treatment  Patient Details  Name: Allison [name] MRN: [id number] Date of Birth: [birth year] Referring Provider: Toni ArthursJohn Hewitt MD.   Encounter Date: 03/20/2017  PT End of Session - 03/20/17 1648    Visit Number  17    Number of Visits  18    Date for PT Re-Evaluation  03/06/17    Authorization Type  Went to MD 01-31-17 and said to cont 2x wk/ 4 weeks    PT Start Time  1647    PT Stop Time  1753    PT Time Calculation (min)  66 min    Activity Tolerance  Patient tolerated treatment well    Behavior During Therapy  [facility] for tasks assessed/performed       Past Medical History:  Diagnosis Date  . Hypercholesteremia   . Hypertension     Past Surgical History:  Procedure Laterality Date  . ABDOMINAL HYSTERECTOMY    . ADENOIDECTOMY    . CESAREAN SECTION     X 3  . DILATION AND CURETTAGE OF UTERUS      There were no vitals filed for this visit.  Subjective Assessment - 03/20/17 1646    Subjective  Reports that her foot feels about the same. Reports that she feels she is walking more normal.    Limitations  Walking    How long can you walk comfortably?  CAM on left WBAT with bilateral axillary crutuches.    Patient Stated Goals  Get back to normal.    Currently in Pain?  Yes    Pain Score  1     Pain Location  Ankle    Pain Orientation  Left    Pain Descriptors / Indicators  Sore    Pain Type  Surgical pain    Pain Onset  More than a month ago         [facility] - 03/20/17 0001      Assessment   Medical Diagnosis  Trimalleolar fracture, closed.    Onset Date/Surgical Date  11/12/16    Next MD Visit  04/02/2017                  [facility] Treatment/Exercise - 03/20/17 0001      Modalities   Modalities  Electrical Stimulation;Vasopneumatic      Electrical Stimulation   Electrical  Stimulation Location  L ankle    Electrical Stimulation Action  Pre-Mod    Electrical Stimulation Parameters  80-150 hz x15 min    Electrical Stimulation Goals  Edema;Pain      Vasopneumatic   Number Minutes Vasopneumatic   15 minutes    Vasopnuematic Location   Ankle    Vasopneumatic Pressure  Medium    Vasopneumatic Temperature   34      Ankle Exercises: Aerobic   Stationary Bike  L2 x10 min    Elliptical  L5, R5 x10 min      Ankle Exercises: Standing   Rocker Board  5 minutes for stretch    Heel Raises  Other (comment) x30 reps    Toe Raise  Other (comment) x30 reps    Other Standing Ankle Exercises  L forward step up 6" step x30 reps, L lateral step up 6" x30 reps      Ankle Exercises: Seated   Other Seated Ankle Exercises  L ankle isolator  1# x30 reps PF,Env, Inv each                  PT Long Term Goals - 03/20/17 1745      PT LONG TERM GOAL #1   Title  Independent with a HEP.    Time  4    Period  Weeks    Status  On-going      PT LONG TERM GOAL #2   Title  Increase left ankle dorsiflexion to 6- 8 degrees to normalize the patient's gait pattern.    Time  4    Period  Weeks    Status  On-going      PT LONG TERM GOAL #3   Title  Increase left ankle strength to 4+ to 5/5 to increase stability for functional tasks.    Time  4    Period  Weeks    Status  On-going      PT LONG TERM GOAL #4   Title  Walk without deviation in clinic 500 feet.    Time  4    Period  Weeks    Status  On-going      PT LONG TERM GOAL #5   Title  Perform ADL's with pain not > 2/10.    Period  Weeks    Status  On-going            Plan - 03/20/17 1747    Clinical Impression Statement  Patient tolerated today's treatment well with arrival with low level L ankle soreness. Patient able to ambulate exercises in standing without complaint. Patient continues to experience greater discomfort with L ankle inversion. Patient experienced tightness in L achilles region as well  as lateral forefoot region. Patient's L ankle continues to be swollen into calf region. Normal modalities response noted following removal of the modalities.      Rehab Potential  Excellent    PT Frequency  3x / week    PT Duration  4 weeks    PT Treatment/Interventions  ADLs/Self Care Home Management;Cryotherapy;Electrical Stimulation;Moist Heat;Patient/family education;Therapeutic exercise;Therapeutic activities;Neuromuscular re-education;Manual techniques;Vasopneumatic Device    PT Next Visit Plan  Continue with ankle strengthening per MPT POC.    Consulted and Agree with Plan of Care  Patient       Patient will benefit from skilled therapeutic intervention in order to improve the following deficits and impairments:  Abnormal gait, Decreased activity tolerance, Pain, Increased edema, Decreased range of motion  Visit Diagnosis: Pain in left ankle and joints of left foot  Stiffness of left ankle, not elsewhere classified     Problem List Patient Active Problem List   Diagnosis Date Noted  . Post hysterectomy menopause 10/11/2016    Marvell FullerKelsey P Kennon, PTA 03/20/2017, 6:08 PM  [facility] [address] [place], [place], [address] Phone: [phone]   Fax:  [phone]  Name: Allison [name] MRN: [id number] Date of Birth: [birth year]

## 2017-03-22 ENCOUNTER — Encounter: Payer: Self-pay | Admitting: Physical Therapy

## 2017-03-22 ENCOUNTER — Ambulatory Visit: Payer: Worker's Compensation | Admitting: Physical Therapy

## 2017-03-22 DIAGNOSIS — M25672 Stiffness of left ankle, not elsewhere classified: Secondary | ICD-10-CM

## 2017-03-22 DIAGNOSIS — M25572 Pain in left ankle and joints of left foot: Secondary | ICD-10-CM

## 2017-03-22 NOTE — Therapy (Signed)
[facility] [address] [place], [place], [address] Phone: [phone]   Fax:  [phone]  Physical Therapy Treatment  Patient Details  Name: Allison [name] MRN: [id number] Date of Birth: 12-15-1960 Referring Provider: Toni ArthursJohn Hewitt MD.   Encounter Date: 03/22/2017  PT End of Session - 03/22/17 1737    Visit Number  18    Number of Visits  18    Date for PT Re-Evaluation  03/06/17    Authorization Type  Went to MD 01-31-17 and said to cont 2x wk/ 4 weeks    PT Start Time  1647    PT Stop Time  1735    PT Time Calculation (min)  48 min    Activity Tolerance  Patient tolerated treatment well    Behavior During Therapy  [facility] for tasks assessed/performed       Past Medical History:  Diagnosis Date  . Hypercholesteremia   . Hypertension     Past Surgical History:  Procedure Laterality Date  . ABDOMINAL HYSTERECTOMY    . ADENOIDECTOMY    . CESAREAN SECTION     X 3  . COLONOSCOPY N/A 04/14/2013   Procedure: COLONOSCOPY;  Surgeon: Corbin Adeobert M Rourk, MD;  Location: [facility];  Service: Endoscopy;  Laterality: N/A;  2:15 PM-moved to [address] notified pt  . DILATION AND CURETTAGE OF UTERUS      There were no vitals filed for this visit.  Subjective Assessment - 03/22/17 1706    Subjective  Reports that her ankle feels the same and sore. Reports that she sometimes has a twinge of pain on the medial and lateral side of her ankle.  (Pended)     Limitations  Walking  (Pended)     How long can you walk comfortably?  CAM on left WBAT with bilateral axillary crutuches.  (Pended)     Patient Stated Goals  Get back to normal.  Andrez Grime(Pended)          [facility] PT Assessment - 03/22/17 0001      Assessment   Medical Diagnosis  Trimalleolar fracture, closed.    Onset Date/Surgical Date  11/12/16    Next MD Visit  04/02/2017      ROM / Strength   AROM / PROM / Strength  AROM      AROM   Overall AROM   Within functional limits for tasks  performed    AROM Assessment Site  Ankle    Right/Left Ankle  Left    Left Ankle Dorsiflexion  6                  [facility] Adult PT Treatment/Exercise - 03/22/17 0001      Vasopneumatic   Number Minutes Vasopneumatic   15 minutes    Vasopnuematic Location   Ankle    Vasopneumatic Pressure  Medium    Vasopneumatic Temperature   34      Ankle Exercises: Aerobic   Stationary Bike  L3 x10 min    Elliptical  L5, R5 x10 min      Ankle Exercises: Standing   Rocker Board  Other (comment) x20 reps for stretch    Heel Raises  20 reps    Other Standing Ankle Exercises  L forward step up 6" step x30 reps, L lateral step up 6" x30 reps                  PT Long Term Goals - 03/22/17 1726  PT LONG TERM GOAL #1   Title  Independent with a HEP.    Time  4    Period  Weeks    Status  Achieved      PT LONG TERM GOAL #2   Title  Increase left ankle dorsiflexion to 6- 8 degrees to normalize the patient's gait pattern.    Time  4    Period  Weeks    Status  Achieved      PT LONG TERM GOAL #3   Title  Increase left ankle strength to 4+ to 5/5 to increase stability for functional tasks.    Time  4    Period  Weeks    Status  On-going      PT LONG TERM GOAL #4   Title  Walk without deviation in clinic 500 feet.    Time  4    Period  Weeks    Status  On-going      PT LONG TERM GOAL #5   Title  Perform ADL's with pain not > 2/10.    Period  Weeks    Status  Achieved            Plan - 03/22/17 1737    Clinical Impression Statement  Patient presented in clinic with continued low level L ankle soreness. Patient feels as if she is progressing as she feels as if she can stand longer. Patient continues to experience "twinges" of medial and lateral R ankle pain. Patient's R ankle DF has improved to 6 deg thus meeting LTG. Patient still compliant with towel stretch and encouraged to continue stretch at home. Normal vasopneumatic response noted following removal of  the modality.    Rehab Potential  Excellent    PT Frequency  3x / week    PT Duration  4 weeks    PT Treatment/Interventions  ADLs/Self Care Home Management;Cryotherapy;Electrical Stimulation;Moist Heat;Patient/family education;Therapeutic exercise;Therapeutic activities;Neuromuscular re-education;Manual techniques;Vasopneumatic Device    PT Next Visit Plan  Continue with ankle strengthening per MPT POC.    Consulted and Agree with Plan of Care  Patient       Patient will benefit from skilled therapeutic intervention in order to improve the following deficits and impairments:  Abnormal gait, Decreased activity tolerance, Pain, Increased edema, Decreased range of motion  Visit Diagnosis: Pain in left ankle and joints of left foot  Stiffness of left ankle, not elsewhere classified     Problem List Patient Active Problem List   Diagnosis Date Noted  . Post hysterectomy menopause 10/11/2016    Marvell FullerKelsey P Denis Carreon, PTA 03/22/2017, 5:42 PM  [facility] [address] [place], [place], [address] Phone: [phone]   Fax:  [phone]  Name: Allison [name] MRN: [id number] Date of Birth: 02-Jan-1961

## 2017-03-27 ENCOUNTER — Encounter: Payer: Self-pay | Admitting: Physical Therapy

## 2017-03-27 ENCOUNTER — Ambulatory Visit: Payer: Worker's Compensation | Admitting: Physical Therapy

## 2017-03-27 DIAGNOSIS — M25572 Pain in left ankle and joints of left foot: Secondary | ICD-10-CM

## 2017-03-27 DIAGNOSIS — M25672 Stiffness of left ankle, not elsewhere classified: Secondary | ICD-10-CM

## 2017-03-27 NOTE — Therapy (Addendum)
[facility] [place], [place], [address] Phone: [phone]   Fax:  [phone]  Physical Therapy Treatment  Patient Details  Name: Allison [name] MRN: [id number] Date of Birth: 18-Sep-1960 Referring Provider: Wylene Simmer MD.   Encounter Date: 03/27/2017  PT End of Session - 03/27/17 1659    Visit Number  19    Number of Visits  18    Date for PT Re-Evaluation  03/06/17    Authorization Type  Went to MD 01-31-17 and said to cont 2x wk/ 4 weeks    PT Start Time  1645    PT Stop Time  1727    PT Time Calculation (min)  42 min    Activity Tolerance  Patient tolerated treatment well    Behavior During Therapy  [facility] for tasks assessed/performed       Past Medical History:  Diagnosis Date  . Hypercholesteremia   . Hypertension     Past Surgical History:  Procedure Laterality Date  . ABDOMINAL HYSTERECTOMY    . ADENOIDECTOMY    . CESAREAN SECTION     X 3  . COLONOSCOPY N/A 04/14/2013   Procedure: COLONOSCOPY;  Surgeon: Daneil Dolin, MD;  [facility];  Service: Endoscopy;  Laterality: N/A;  2:15 PM-moved to [place] notified pt  . DILATION AND CURETTAGE OF UTERUS      There were no vitals filed for this visit.  Subjective Assessment - 03/27/17 1658    Subjective  Reports that her foot is just sore.    Limitations  Walking    How long can you walk comfortably?  CAM on left WBAT with bilateral axillary crutuches.    Patient Stated Goals  Get back to normal.    Currently in Pain?  Yes    Pain Score  1     Pain Location  Ankle    Pain Orientation  Left    Pain Descriptors / Indicators  Sore    Pain Type  Surgical pain    Pain Onset  More than a month ago         [facility] PT Assessment - 03/27/17 0001      Assessment   Medical Diagnosis  Trimalleolar fracture, closed.    Onset Date/Surgical Date  11/12/16    Next MD Visit  04/02/2017      ROM / Strength   AROM / PROM / Strength  AROM;Strength       AROM   Overall AROM   Within functional limits for tasks performed    AROM Assessment Site  Ankle    Right/Left Ankle  Left    Left Ankle Dorsiflexion  7      Strength   Overall Strength  Within functional limits for tasks performed    Strength Assessment Site  Ankle    Right/Left Ankle  Left    Left Ankle Dorsiflexion  4+/5    Left Ankle Plantar Flexion  5/5    Left Ankle Inversion  4+/5    Left Ankle Eversion  4+/5                  [facility] Adult PT Treatment/Exercise - 03/27/17 0001      Ambulation/Gait   Ambulation/Gait  Yes    Ambulation/Gait Assistance  7: Independent    Ambulation Distance (Feet)  585 Feet    Assistive device  None    Gait Pattern  Within Functional Limits;Antalgic Minimal antalgic gait  Ambulation Surface  Level;Indoor      Ankle Exercises: Aerobic   Stationary Bike  L3 x10 min    Elliptical  L5, R5 x10 min      Ankle Exercises: Stretches   Soleus Stretch  2 reps;20 seconds    Gastroc Stretch  2 reps;30 seconds    Other Stretch  L seated prostretch x3 min                  PT Long Term Goals - 03/27/17 1720      PT LONG TERM GOAL #1   Title  Independent with a HEP.    Time  4    Period  Weeks    Status  Achieved      PT LONG TERM GOAL #2   Title  Increase left ankle dorsiflexion to 6- 8 degrees to normalize the patient's gait pattern.    Time  4    Period  Weeks    Status  Achieved      PT LONG TERM GOAL #3   Title  Increase left ankle strength to 4+ to 5/5 to increase stability for functional tasks.    Time  4    Period  Weeks    Status  Achieved      PT LONG TERM GOAL #4   Title  Walk without deviation in clinic 500 feet.    Time  4    Period  Weeks    Status  Partially Met      PT LONG TERM GOAL #5   Title  Perform ADL's with pain not > 2/10.    Period  Weeks    Status  Achieved            Plan - 03/27/17 1735    Clinical Impression Statement  Patient has progressed very well following L  trimalleolar fracture with resulting surgery. Patient reports diligence with ankle stretching at home. Patient has achieved all goals although partially achieving gait goal as she still has minimal antlagic gait deviation. Patient denied any modalities following today's treatment.    Rehab Potential  Excellent    PT Frequency  3x / week    PT Duration  4 weeks    PT Treatment/Interventions  ADLs/Self Care Home Management;Cryotherapy;Electrical Stimulation;Moist Heat;Patient/family education;Therapeutic exercise;Therapeutic activities;Neuromuscular re-education;Manual techniques;Vasopneumatic Device    PT Next Visit Plan  Continue with ankle strengthening per MPT POC.    Consulted and Agree with Plan of Care  Patient       Patient will benefit from skilled therapeutic intervention in order to improve the following deficits and impairments:  Abnormal gait, Decreased activity tolerance, Pain, Increased edema, Decreased range of motion  Visit Diagnosis: Pain in left ankle and joints of left foot  Stiffness of left ankle, not elsewhere classified     Problem List Patient Active Problem List   Diagnosis Date Noted  . Post hysterectomy menopause 10/11/2016    Standley Brooking, PTA 03/27/17 5:45 PM   [facility] [address] [place], [place], [address] Phone: [phone]   Fax:  [phone]  Name: Allison [name] MRN: [id number] Date of Birth: [birth year]  PHYSICAL THERAPY DISCHARGE SUMMARY  Visits from Start of Care: 19.  Current functional level related to goals / functional outcomes: See above.   Remaining deficits: Goals met.   Education / Equipment: HEP. Plan: Patient agrees to discharge.  Patient goals were met. Patient is being discharged due to meeting the stated  rehab goals.  ?????         Mali Applegate MPT

## 2017-04-10 ENCOUNTER — Ambulatory Visit: Payer: Self-pay | Admitting: Internal Medicine

## 2017-05-15 ENCOUNTER — Other Ambulatory Visit: Payer: Self-pay

## 2017-05-15 ENCOUNTER — Ambulatory Visit: Payer: BLUE CROSS/BLUE SHIELD | Admitting: Internal Medicine

## 2017-05-15 ENCOUNTER — Encounter: Payer: Self-pay | Admitting: Internal Medicine

## 2017-05-15 VITALS — BP 131/80 | HR 64 | Temp 97.3°F | Ht 61.0 in | Wt 182.4 lb

## 2017-05-15 DIAGNOSIS — A071 Giardiasis [lambliasis]: Secondary | ICD-10-CM

## 2017-05-15 NOTE — Patient Instructions (Signed)
You have a Giardia infection - get your well water checked by the health department  Tinidazole 2 grams orally - one time dose - we will call into the pharmacy  Office visit in 8 weeks

## 2017-05-15 NOTE — Progress Notes (Signed)
Primary Care Physician:  Benita Stabile, MD Primary Gastroenterologist:  Dr. Jena Gauss  Pre-Procedure History & Physical: HPI:  Allison [name] is a 57 y.o. female here for evaluation of nearly a 1 year history of abdominal bloating and watery nonbloody diarrhea. States she has lifelong constipation but little over year ago she started having the above symptoms. In July, she presented to her PCP;  stool studies were ordered. 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 She has been treated with dicyclomine but no specific therapy. She states since July,  diarrhea has slowed but has not resolved. She continues to have symptoms.  She does drink well water with her husband. Depth of well unknown. Have 1 dog. No camping or unusual exposure/travel.  Positive family history of colon cancer in her sister at a young age. She will be due for a high risk screening colonoscopy later in the year.  Past Medical History:  Diagnosis Date  . Hypercholesteremia   . Hypertension     Past Surgical History:  Procedure Laterality Date  . ABDOMINAL HYSTERECTOMY    . ADENOIDECTOMY    . CESAREAN SECTION     X 3  . COLONOSCOPY N/A 04/14/2013   Procedure: COLONOSCOPY;  Surgeon: Corbin Ade, MD;  [facility];  Service: Endoscopy;  Laterality: N/A;  2:15 PM-moved to [address] notified pt  . DILATION AND CURETTAGE OF UTERUS      Prior to Admission medications   Medication Sig Start Date End Date Taking? Authorizing Provider  ALPRAZolam Prudy Feeler) 1 MG tablet Take 1 mg by mouth daily as needed for anxiety.    Yes [provider]  citalopram (CELEXA) 20 MG tablet Take 30 mg by mouth daily.    Yes [provider]  Enalapril-Hydrochlorothiazide 5-12.5 MG per tablet Take 1 tablet by mouth daily.   Yes [provider]  estradiol (ESTRACE) 1 MG tablet Take 1 tablet (1 mg total) by mouth daily. Patient taking differently: Take 0.5 mg by  mouth daily.  10/11/16  Yes Tilda Burrow, MD    Allergies as of 05/15/2017  . (No Known Allergies)    Family History  Problem Relation Age of Onset  . Colon cancer Sister   . Hypertension Sister   . Diabetes Brother   . Hypertension Brother     Social History   Socioeconomic History  . Marital status: Married    Spouse name: Not on file  . Number of children: Not on file  . Years of education: Not on file  . Highest education level: Not on file  Social Needs  . Financial resource strain: Not on file  . Food insecurity - worry: Not on file  . Food insecurity - inability: Not on file  . Transportation needs - medical: Not on file  . Transportation needs - non-medical: Not on file  Occupational History  . Not on file  Tobacco Use  . Smoking status: Never Smoker  . Smokeless tobacco: Never Used  Substance and Sexual Activity  . Alcohol use: No    Alcohol/week: 0.0 oz  . Drug use: No  . Sexual activity: Yes  Other Topics Concern  . Not on file  Social History Narrative  . Not on file    Review of Systems: See HPI, otherwise negative ROS  Physical Exam: BP 131/80   Pulse 64   Temp (!) 97.3 F (36.3 C) (  Oral)   Ht 5\' 1"  (1.549 m)   Wt 182 lb 6.4 oz (82.7 kg)   BMI 34.46 kg/m  General:   Alert,  Well-developed, well-nourished, pleasant and cooperative in NAD Neck:  Supple; no masses or thyromegaly. No significant cervical adenopathy. Lungs:  Clear throughout to auscultation.   No wheezes, crackles, or rhonchi. No acute distress. Heart:  Regular rate and rhythm; no murmurs, clicks, rubs,  or gallops. Abdomen: Non-distended, normal bowel sounds.  Soft and nontender without appreciable mass or hepatosplenomegaly.  Pulses:  Normal pulses noted. Extremities:  Without clubbing or edema.  Impression:  Pleasant 57 year old lady with abdominal bloating and watery diarrhea of one years duration. Giardia found on wet prep several months ago. She is not yet clinically  recover from the infection on her own.  Treatment is warranted. I discussed with the patient the natural reservoir Giardia and modes of infection in humans.  Recommendations:  Will treat Giardia infection at this time.  Tinidazole 2 grams orally - one time dose - we will call into the pharmacy.  No alcohol with this medication.  Office visit in 8 weeks  nticipate high-risk screening colonoscopy later in the year.       Notice: This dictation was prepared with Dragon dictation along with smaller phrase technology. Any transcriptional errors that result from this process are unintentional and may not be corrected upon review.

## 2017-05-15 NOTE — Progress Notes (Signed)
Tinidazole 2 gm or-aly, one time dose called into [facility] as directed.

## 2017-06-04 ENCOUNTER — Ambulatory Visit: Payer: Self-pay | Admitting: Family Medicine

## 2017-06-05 ENCOUNTER — Encounter: Payer: Self-pay | Admitting: Internal Medicine

## 2017-06-07 ENCOUNTER — Encounter: Payer: Self-pay | Admitting: Family Medicine

## 2017-06-07 ENCOUNTER — Other Ambulatory Visit: Payer: Self-pay

## 2017-06-07 ENCOUNTER — Ambulatory Visit: Payer: BLUE CROSS/BLUE SHIELD | Admitting: Family Medicine

## 2017-06-07 VITALS — BP 142/82 | HR 76 | Temp 98.6°F | Resp 16 | Ht 61.0 in | Wt 178.5 lb

## 2017-06-07 DIAGNOSIS — Z23 Encounter for immunization: Secondary | ICD-10-CM

## 2017-06-07 DIAGNOSIS — I10 Essential (primary) hypertension: Secondary | ICD-10-CM

## 2017-06-07 DIAGNOSIS — B001 Herpesviral vesicular dermatitis: Secondary | ICD-10-CM | POA: Diagnosis not present

## 2017-06-07 DIAGNOSIS — F419 Anxiety disorder, unspecified: Secondary | ICD-10-CM

## 2017-06-07 MED ORDER — HYDROCHLOROTHIAZIDE 25 MG PO TABS
25.0000 mg | ORAL_TABLET | Freq: Every day | ORAL | 1 refills | Status: DC
Start: 2017-06-07 — End: 2017-11-28

## 2017-06-07 MED ORDER — ENALAPRIL MALEATE 5 MG PO TABS
5.0000 mg | ORAL_TABLET | Freq: Every day | ORAL | 0 refills | Status: DC
Start: 2017-06-07 — End: 2017-09-03

## 2017-06-07 MED ORDER — VALACYCLOVIR HCL 1 G PO TABS: ORAL_TABLET | ORAL | 0 refills | Status: DC

## 2017-06-07 MED ORDER — ALPRAZOLAM 0.5 MG PO TABS: 0.5000 mg | ORAL_TABLET | Freq: Every evening | ORAL | 0 refills | Status: DC | PRN

## 2017-06-07 NOTE — Patient Instructions (Signed)
If you get a cold sore: take valtrex 1000 mg, 2 pills twice a day for 1 day

## 2017-06-07 NOTE — Progress Notes (Signed)
Patient ID: Allison IshiharaVickey C Fentress, female    DOB: August 22, 1960, 57 y.o.   MRN: [id number]  Chief Complaint  Patient presents with  . Establish Care    Allergies Patient has no known allergies.  Subjective:   Allison [name] is a 57 y.o. female who presents to [facility] today.  HPI Mrs. Brooke DareKing presents today as a new patient visit to establish care.  She lives in [place], [place] and works at a [employer].  She is been married for over 40 years and has 3 children.  She has a history of hypertension, anxiety, and hyperlipidemia.  She comes in today for several concerns.  She reports that she has had a history of high blood pressure from quite some time.  She reports that her blood pressure is never really been controlled and usually runs in the 140s-150s over 80s-90s.  She had an ankle injury within the past year and had to have surgery.  She reports that prior to her an ankle injury she exercised greater than 3 times a week.  She reports that while she was immobile with her ankle problems she did gain a good bit of weight.  She reports that she is motivated to lose weight and get back in better physical shape.  She currently takes enalapril 2.5/HCTZ 12.5 mg, 1 p.o. daily for her blood pressure.  She denies any chest pain, shortness of breath, lower extremity edema, or palpitations.  She has no history of heart attack, stroke, or diabetes.  Her family history is significant for cardiac disease.  She reports that she is on Celexa 30 mg a day for anxiety.  She reports that she tends to worry a lot and fret over small details in her life.  She reports she is not depressed and feels happy but does tend to stress and be rather high strung.  She reports that several months ago her previous primary care physician increased her Celexa from 20 mg a day to 30 mg a day.  She reports that she has not noticed much of a change with this dose increase.  Her medication list does reveal Xanax 1 mg  at night.  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  She reports that she was started on this medication for sleep after the death of her father and discontinued it.  She reports that the medication helps her to calm down to be able to sleep when she gets home from work.  She reports that she is not addicted to this medication.  She denies any tobacco, alcohol, or drug use.  She reports that the medication helps with sleep and helps her to calm down in the evening.  She denies any suicidal or homicidal ideation.    She reports that she does get cold sores several times a year on her lip and would like a refill of Valtrex.  She denies any history of genital herpes.  She reports she likes to have the medication on hand for when she gets a cold sore.  She reports the cold sores are usually triggered by stress, illness, or sun exposure.  She denies any current outbreak.    Past Medical History:  Diagnosis Date  . Depression   . Hypercholesteremia   . Hypertension     Past Surgical History:  Procedure Laterality Date  . ABDOMINAL HYSTERECTOMY  total hysterectomy with ovaries are removed.   . ADENOIDECTOMY    . ANKLE CLOSED REDUCTION    . CESAREAN SECTION     X 3  . COLONOSCOPY N/A 04/14/2013   Procedure: COLONOSCOPY;  Surgeon: Corbin Ade, MD;  [facility];  Service: Endoscopy;  Laterality: N/A;  2:15 PM-moved to [address] notified pt  . DILATION AND CURETTAGE OF UTERUS    . HEMORROIDECTOMY      Family History  Problem Relation Age of Onset  . Colon cancer Sister   . Hypertension Sister   . Diabetes Sister   . Diabetes Brother   . Hypertension Brother   . Thyroid cancer Brother   . Alzheimer's disease Mother   . Heart attack Father   . Hypertension Brother      Social History   Socioeconomic History  . Marital status: Married    Spouse name: None  . Number of children: None  . Years  of education: None  . Highest education level: None  Social Needs  . Financial resource strain: None  . Food insecurity - worry: None  . Food insecurity - inability: None  . Transportation needs - medical: None  . Transportation needs - non-medical: None  Occupational History  . None  Tobacco Use  . Smoking status: Never Smoker  . Smokeless tobacco: Never Used  Substance and Sexual Activity  . Alcohol use: No    Alcohol/week: 0.0 oz  . Drug use: No  . Sexual activity: Yes    Partners: Male    Birth control/protection: Surgical  Other Topics Concern  . None  Social History Narrative   Lives in [place]. Married for 39 years. 3 children. 2 grandchildren. Has 6 siblings. Mother is decreased.    Attends church. [employer].   Eats all food groups.   Wear seatbelt.   Current Outpatient Medications on File Prior to Visit  Medication Sig Dispense Refill  . citalopram (CELEXA) 20 MG tablet Take 30 mg by mouth daily.     . Enalapril-Hydrochlorothiazide 5-12.5 MG per tablet Take 1 tablet by mouth daily.    Marland Kitchen estradiol (ESTRACE) 1 MG tablet Take 1 tablet (1 mg total) by mouth daily. (Patient taking differently: Take 0.5 mg by mouth daily. ) 30 tablet 11   No current facility-administered medications on file prior to visit.      Review of Systems  Constitutional: Negative for activity change, appetite change and fever.  HENT: Negative for dental problem, rhinorrhea, sneezing, trouble swallowing and voice change.   Eyes: Negative for visual disturbance.  Respiratory: Negative for cough, chest tightness and shortness of breath.   Cardiovascular: Negative for chest pain, palpitations and leg swelling.  Gastrointestinal: Negative for abdominal pain, nausea and vomiting.  Genitourinary: Negative for dysuria, frequency and urgency.  Musculoskeletal: Negative for arthralgias and myalgias.  Skin: Negative for rash.  Neurological: Negative for dizziness, seizures, syncope,  light-headedness and numbness.  Hematological: Negative for adenopathy.  Psychiatric/Behavioral: Positive for sleep disturbance. The patient is nervous/anxious.      Objective:   BP (!) 142/82 (BP Location: Left Arm, Patient Position: Sitting, Cuff Size: Normal)   Pulse 76   Temp 98.6 F (37 C) (Temporal)   Resp 16   Ht 5\' 1"  (1.549 m)   Wt 178 lb 8 oz (81 kg)   SpO2 98%   BMI 33.73 kg/m   Physical Exam  Constitutional: She is oriented to person, place, and time. She appears well-developed  and well-nourished. No distress.  HENT:  Head: Normocephalic and atraumatic.  Mouth/Throat: Oropharynx is clear and moist.  Eyes: Conjunctivae and EOM are normal. Pupils are equal, round, and reactive to light. No scleral icterus.  Neck: Normal range of motion. Neck supple. No thyromegaly present.  Cardiovascular: Normal rate, regular rhythm and normal heart sounds.  Pulmonary/Chest: Effort normal and breath sounds normal. No respiratory distress.  Neurological: She is alert and oriented to person, place, and time. No cranial nerve deficit. Coordination normal.  Skin: Skin is warm and dry.  Psychiatric: She has a normal mood and affect. Her behavior is normal. Judgment and thought content normal.  Nursing note and vitals reviewed.     Assessment and Plan  1. HTN, goal below 140/90 Blood pressure goals and management discussed with patient today.  At this time will discontinue enalapril 2.5 mg/HCTZ 12.5 mg.  Will start enalapril 5 mg 1 p.o. daily.  Will increase HCTZ to 25 mg 1 p.o. daily.  Diet, exercise, and weight loss management discussed with patient.  We did discuss the [employer] and handouts were given to her today. - enalapril (VASOTEC) 5 MG tablet; Take 1 tablet (5 mg total) by mouth daily.  Dispense: 90 tablet; Refill: 0 - hydrochlorothiazide (HYDRODIURIL) 25 MG tablet; Take 1 tablet (25 mg total) by mouth daily.  Dispense: 90 tablet; Refill: 1 Lifestyle modifications discussed with  patient including a diet emphasizing vegetables, fruits, and whole grains. Limiting intake of sodium to less than 2,400 mg per day.  Recommendations discussed include consuming low-fat dairy products, poultry, fish, legumes, non-tropical vegetable oils, and nuts; and limiting intake of sweets, sugar-sweetened beverages, and red meat. Discussed following a plan such as the Dietary Approaches to Stop Hypertension (DASH) diet. Patient to read up on this diet.   2. Immunization due Vaccination given today. - Tdap vaccine greater than or equal to 7yo IM - Flu Vaccine QUAD 6+ mos PF IM (Fluarix Quad PF)  3. Cold sore The patient was counseled concerning Valtrex use.  She was told to take 2 pills twice a day for 1 day at the onset of symptoms.  She voiced understanding. - valACYclovir (VALTREX) 1000 MG tablet; Take two pills twice a day as directed  Dispense: 20 tablet; Refill: 0  4. Anxiety Discussed with patient management of anxiety and sleep disorder.  I did tell patient that I would not refill this Xanax prescription on a chronic basis.  I did tell her that I would help her to get off of this medication by titrating down her dose.  At this time we will decrease her dose to 0.5 mg.  She will take 1/2 tablet as directed as needed.  She was counseled concerning the habit-forming nature of this medication.  We also discussed its sedation risk.  We discussed other options for sleep management.  We discussed good sleep hygiene.  We also discussed better control of her anxiety on a daily basis.  She does not wish to increase her Celexa at this time.  She would like to try exercise and lifestyle modifications.  We did discuss that if she does not feel these are working over the next couple weeks to call before her next appointment and I will increase her Celexa to 40 mg a day prior to her office visit.  Suicide risks evaluated and documented in note if present or in the area below. Patient has protective  factors of family and community support.   Patient displays problem  solving skills.   Patient specifically denies suicide ideation. Patient has access/information to healthcare contacts if situation or mood changes where patient is a risk to self or others or mood becomes unstable.   During the encounter, the patient had good eye contact and firm handshake regarding safety contract and agreement to seek help if mood worsens and not to harm self.   Patient understands the treatment plan and is in agreement. Agrees to keep follow up and call prior or return to clinic if needed.   - ALPRAZolam (XANAX) 0.5 MG tablet; Take 1 tablet (0.5 mg total) by mouth at bedtime as needed for anxiety.  Dispense: 30 tablet; Refill: 0 Patient counseled in detail regarding the risks of medication. Told to call or return to clinic if develop any worrisome signs or symptoms. Patient voiced understanding.   Return in about 4 weeks (around 07/05/2017). Aliene Beams, MD 06/08/2017

## 2017-07-04 ENCOUNTER — Ambulatory Visit (INDEPENDENT_AMBULATORY_CARE_PROVIDER_SITE_OTHER): Payer: BLUE CROSS/BLUE SHIELD | Admitting: Family Medicine

## 2017-07-04 ENCOUNTER — Other Ambulatory Visit: Payer: Self-pay

## 2017-07-04 ENCOUNTER — Encounter: Payer: Self-pay | Admitting: Family Medicine

## 2017-07-04 VITALS — BP 120/82 | HR 56 | Temp 98.2°F | Resp 16 | Ht 61.0 in | Wt 178.0 lb

## 2017-07-04 DIAGNOSIS — Z0000 Encounter for general adult medical examination without abnormal findings: Secondary | ICD-10-CM | POA: Diagnosis not present

## 2017-07-04 DIAGNOSIS — R001 Bradycardia, unspecified: Secondary | ICD-10-CM | POA: Insufficient documentation

## 2017-07-04 DIAGNOSIS — E669 Obesity, unspecified: Secondary | ICD-10-CM

## 2017-07-04 DIAGNOSIS — I10 Essential (primary) hypertension: Secondary | ICD-10-CM | POA: Diagnosis not present

## 2017-07-04 LAB — BASIC METABOLIC PANEL
BUN: 16 mg/dL (ref 7–25)
CALCIUM: 9.7 mg/dL (ref 8.6–10.4)
CHLORIDE: 104 mmol/L (ref 98–110)
CO2: 31 mmol/L (ref 20–32)
Creat: 0.66 mg/dL (ref 0.50–1.05)
GLUCOSE: 88 mg/dL (ref 65–99)
POTASSIUM: 4.3 mmol/L (ref 3.5–5.3)
SODIUM: 141 mmol/L (ref 135–146)

## 2017-07-04 NOTE — Progress Notes (Signed)
Patient ID: Allison [name], female    DOB: 1960-10-22, 57 y.o.   MRN: [id number]  Chief Complaint  Patient presents with  . Annual Exam    Allergies Patient has no known allergies.  Subjective:   Allison [name] is a 57 y.o. female who presents to [facility] today.  HPI Here for wellness/CPE. Had brought in labs at last visit and they were scanned in the chart. Has been doing well. Feels good.  Has been taking the increased dose of HCTZ and enalapril.  Denies any side effects with medication.  No cough, shortness of breath, edema, or palpitations.  Reports that her energy level is good.  Has cut down on the Xanax used to approximately twice a month.  Reports that has been sleeping better.  Denies any depression or anxiety.  Mood is good.  No suicidal or homicidal ideations.  Taking the Celexa daily.  Has follow-up for Pap pelvic and breast exam with Dr. Emelda Fear within the next month.  Patient is due for her colonoscopy at the end of this year due to the fact that her sister has had colon cancer. She does go to the eye doctor every year.  Goes to the dentist every 6 months.  Does not have any complaints today.  Is been compliant with trying to eat a healthy diet.  Has not been exercising for quite some time.  Used to run in the past but is not been doing any exercise for a while.  Has never been seen by cardiologist.    Past Medical History:  Diagnosis Date  . Depression   . Hypercholesteremia   . Hypertension     Past Surgical History:  Procedure Laterality Date  . ABDOMINAL HYSTERECTOMY     total hysterectomy with ovaries are removed.   . ADENOIDECTOMY    . ANKLE CLOSED REDUCTION    . CESAREAN SECTION     X 3  . COLONOSCOPY N/A 04/14/2013   Procedure: COLONOSCOPY;  Surgeon: Corbin Ade, MD;  [facility];  Service: Endoscopy;  Laterality: N/A;  2:15 PM-moved to [address] notified pt  . DILATION AND CURETTAGE OF UTERUS    . HEMORROIDECTOMY       Family History  Problem Relation Age of Onset  . Colon cancer Sister   . Hypertension Sister   . Diabetes Sister   . Diabetes Brother   . Hypertension Brother   . Thyroid cancer Brother   . Alzheimer's disease Mother   . Heart attack Father   . Hypertension Brother      Social History   Socioeconomic History  . Marital status: Married    Spouse name: Loraine Leriche  . Number of children: 3  . Years of education: None  . Highest education level: None  Social Needs  . Financial resource strain: Not hard at all  . Food insecurity - worry: Never true  . Food insecurity - inability: Never true  . Transportation needs - medical: No  . Transportation needs - non-medical: No  Occupational History  . None  Tobacco Use  . Smoking status: Never Smoker  . Smokeless tobacco: Never Used  Substance and Sexual Activity  . Alcohol use: No    Alcohol/week: 0.0 oz  . Drug use: No  . Sexual activity: Yes    Partners: Male    Birth control/protection: Surgical  Other Topics Concern  . None  Social History Narrative   Lives in [place].  Married for 39 years. 3 children. 2 grandchildren. Has 6 siblings. Mother is decreased.    Attends church. [employer].   Eats all food groups.   Wear seatbelt.    Review of Systems  Constitutional: Negative for activity change, appetite change, chills, fatigue, fever and unexpected weight change.  HENT: Negative for dental problem, hearing loss, nosebleeds, postnasal drip, rhinorrhea, sinus pressure and trouble swallowing.   Eyes: Negative for visual disturbance.  Respiratory: Negative for cough, chest tightness and shortness of breath.   Cardiovascular: Negative for chest pain, palpitations and leg swelling.  Gastrointestinal: Negative for abdominal distention, abdominal pain, diarrhea, nausea, rectal pain and vomiting.  Genitourinary: Negative for dysuria, frequency and urgency.  Musculoskeletal: Negative for arthralgias, back pain and myalgias.   Skin: Negative for rash.  Neurological: Negative for dizziness, tremors, syncope, facial asymmetry, weakness, light-headedness, numbness and headaches.  Hematological: Negative for adenopathy.  Psychiatric/Behavioral: Negative for agitation, behavioral problems, dysphoric mood, self-injury, sleep disturbance and suicidal ideas. The patient is not nervous/anxious.      Objective:   BP 120/82 (BP Location: Left Arm, Patient Position: Sitting, Cuff Size: Normal)   Pulse (!) 56   Temp 98.2 F (36.8 C) (Temporal)   Resp 16   Ht 5\' 1"  (1.549 m)   Wt 178 lb (80.7 kg)   SpO2 97%   BMI 33.63 kg/m   Physical Exam  Constitutional: She is oriented to person, place, and time. She appears well-developed and well-nourished. No distress.  HENT:  Head: Normocephalic and atraumatic.  Right Ear: External ear normal.  Left Ear: External ear normal.  Nose: Nose normal.  Mouth/Throat: Oropharynx is clear and moist. No oropharyngeal exudate.  Eyes: Conjunctivae and EOM are normal. Pupils are equal, round, and reactive to light. No scleral icterus.  Neck: Normal range of motion. Neck supple. No JVD present. No tracheal deviation present. No thyromegaly present.  Cardiovascular: Regular rhythm, normal heart sounds and intact distal pulses. Bradycardia present.  Pulmonary/Chest: Effort normal and breath sounds normal. No respiratory distress. She has no wheezes.  Abdominal: Soft. Bowel sounds are normal. She exhibits no distension. There is no tenderness. There is no guarding.  Musculoskeletal: Normal range of motion. She exhibits no edema or deformity.  Lymphadenopathy:    She has no cervical adenopathy.  Neurological: She is alert and oriented to person, place, and time. She displays normal reflexes. No cranial nerve deficit or sensory deficit. She exhibits normal muscle tone. Coordination normal.  Skin: Skin is warm and dry. Capillary refill takes less than 2 seconds. No rash noted.  Psychiatric:  She has a normal mood and affect. Her behavior is normal. Judgment normal.  Nursing note and vitals reviewed.    Assessment and Plan  1. Well adult exam Age-appropriate anticipatory guidance was given and discussed with patient today. Colonoscopy due at the end of this year secondary to screening and family history of colon cancer. Keep follow-up visit with Dr. Emelda FearFerguson for Pap, pelvic, and clinical breast exam. Mammogram up-to-date.  2. Essential hypertension Blood pressure improved with medication changes.  Continue medications as directed.  Continue diet, exercise and weight loss modifications. - Basic metabolic panel  3. Bradycardia Patient with bradycardia today and prolonged QT interval.  Patient is not on any medications which should slow conduction through the AV node.  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  Will refer to cardiology for evaluation. -  EKG 12-Lead  Immunizations up-to-date.  Patient was counseled if she would like to get the shingles vaccine to return to clinic for nurse visit. Labs from her last visit with her previous PCP reviewed last time and documented in chart.  Her calculated ASCVD at that time was less than 5%.  She was not indicated to be on cholesterol medication or aspirin prevention for lowering her cardiovascular risk.  We did discuss that she needs to lose some weight for her overall health and wrist reduction.  She will follow-up in 6 months or sooner if needed.  We will plan to check a BMP today and at follow-up. Continue Dennis visits every 6 months. Vision exam yearly. Skin cancer/UV sun protection discussed today. Form completed for her job. Return in about 6 months (around 01/01/2018) for Pressure. Aliene Beams, MD 07/04/2017

## 2017-07-04 NOTE — Patient Instructions (Addendum)
Melatonin 4-5 mg, rapid dissolve, [facility], yellow lid/green bottle.    Shingrix Vaccine

## 2017-07-05 ENCOUNTER — Ambulatory Visit: Payer: Self-pay | Admitting: Family Medicine

## 2017-07-05 ENCOUNTER — Encounter: Payer: Self-pay | Admitting: Family Medicine

## 2017-08-06 ENCOUNTER — Ambulatory Visit: Payer: BLUE CROSS/BLUE SHIELD | Admitting: Cardiovascular Disease

## 2017-08-06 ENCOUNTER — Encounter: Payer: Self-pay | Admitting: Cardiovascular Disease

## 2017-08-06 VITALS — BP 121/65 | HR 56 | Ht 61.0 in | Wt 179.4 lb

## 2017-08-06 DIAGNOSIS — R9431 Abnormal electrocardiogram [ECG] [EKG]: Secondary | ICD-10-CM | POA: Diagnosis not present

## 2017-08-06 DIAGNOSIS — R001 Bradycardia, unspecified: Secondary | ICD-10-CM | POA: Diagnosis not present

## 2017-08-06 DIAGNOSIS — I10 Essential (primary) hypertension: Secondary | ICD-10-CM | POA: Diagnosis not present

## 2017-08-06 NOTE — Patient Instructions (Signed)
Medication Instructions:  Continue all current medications.  Labwork: none  Testing/Procedures: none  Follow-Up: As needed.    Any Other Special Instructions Will Be Listed Below (If Applicable).  If you need a refill on your cardiac medications before your next appointment, please call your pharmacy.  

## 2017-08-06 NOTE — Progress Notes (Signed)
CARDIOLOGY CONSULT NOTE  Patient ID: Allison IshiharaVickey C Callas MRN: [id number] DOB/AGE: 1961-02-21 57 y.o.  Admit date: (Not on file) Primary Physician: Aliene BeamsHagler, Rachel, MD Referring Physician: Aliene BeamsHagler, Rachel, MD  Reason for Consultation: Bradycardia  HPI: Allison [name] is a 57 y.o. female who is being seen today for the evaluation of bradycardia at the request of Aliene BeamsHagler, Rachel, MD.   Past medical history includes hypertension.  ECG performed on 07/04/17 which I personally reviewed demonstrated sinus rhythm, 62 bpm, with QTc 535 ms. Another ECG performed on this same day which I reviewed demonstrated sinus bradycardia, 57 bpm, QTc 492 ms. Another ECG performed on the same day which I reviewed demonstrated sinus bradycardia, 56 bpm, QTc 488 ms.  I reviewed lipids from November 2018.  Total cholesterol elevated at 280, triglycerides 165, HDL 76, LDL 171.  TSH 2.46.  The patient denies any symptoms of chest pain, palpitations, shortness of breath, lightheadedness, dizziness, leg swelling, orthopnea, PND, and syncope.  She is here with her husband.  While he says she has some mild snoring, there have been no witnessed apneic episodes.  She has some left ankle pain from a left ankle fracture requiring surgery last year.     No Known Allergies  Current Outpatient Medications  Medication Sig Dispense Refill  . ALPRAZolam (XANAX) 0.5 MG tablet Take 1 tablet (0.5 mg total) by mouth at bedtime as needed for anxiety. 30 tablet 0  . citalopram (CELEXA) 20 MG tablet Take 30 mg by mouth daily.     . enalapril (VASOTEC) 5 MG tablet Take 1 tablet (5 mg total) by mouth daily. 90 tablet 0  . estradiol (ESTRACE) 1 MG tablet Take 1 tablet (1 mg total) by mouth daily. (Patient taking differently: Take 0.5 mg by mouth daily. ) 30 tablet 11  . hydrochlorothiazide (HYDRODIURIL) 25 MG tablet Take 1 tablet (25 mg total) by mouth daily. 90 tablet 1  . valACYclovir (VALTREX) 1000 MG tablet Take two pills  twice a day as directed 20 tablet 0   No current facility-administered medications for this visit.     Past Medical History:  Diagnosis Date  . Depression   . Hypercholesteremia   . Hypertension     Past Surgical History:  Procedure Laterality Date  . ABDOMINAL HYSTERECTOMY     total hysterectomy with ovaries are removed.   . ADENOIDECTOMY    . ANKLE CLOSED REDUCTION    . CESAREAN SECTION     X 3  . COLONOSCOPY N/A 04/14/2013   Procedure: COLONOSCOPY;  Surgeon: Corbin Adeobert M Rourk, MD;  [facility];  Service: Endoscopy;  Laterality: N/A;  2:15 PM-moved to 1100 [address] notified pt  . DILATION AND CURETTAGE OF UTERUS    . HEMORROIDECTOMY      Social History   Socioeconomic History  . Marital status: Married    Spouse name: [name]  . Number of children: 3  . Years of education: Not on file  . Highest education level: Not on file  Occupational History  . Not on file  Social Needs  . Financial resource strain: Not hard at all  . Food insecurity:    Worry: Never true    Inability: Never true  . Transportation needs:    Medical: No    Non-medical: No  Tobacco Use  . Smoking status: Never Smoker  . Smokeless tobacco: Never Used  Substance and Sexual Activity  . Alcohol use: No  Alcohol/week: 0.0 oz  . Drug use: No  . Sexual activity: Yes    Partners: Male    Birth control/protection: Surgical  Lifestyle  . Physical activity:    Days per week: Not on file    Minutes per session: Not on file  . Stress: Not on file  Relationships  . Social connections:    Talks on phone: Not on file    Gets together: Not on file    Attends religious service: Not on file    Active member of club or organization: Not on file    Attends meetings of clubs or organizations: Not on file    Relationship status: Not on file  . Intimate partner violence:    Fear of current or ex partner: Not on file    Emotionally abused: Not on file    Physically abused: Not on file     Forced sexual activity: Not on file  Other Topics Concern  . Not on file  Social History Narrative   Lives in [place]. Married for 39 years. 3 children. 2 grandchildren. Has 6 siblings. Mother is decreased.    Attends church. [employer].   Eats all food groups.   Wear seatbelt.     No family history of premature CAD in 1st degree relatives.  Current Meds  Medication Sig  . ALPRAZolam (XANAX) 0.5 MG tablet Take 1 tablet (0.5 mg total) by mouth at bedtime as needed for anxiety.  . citalopram (CELEXA) 20 MG tablet Take 30 mg by mouth daily.   . enalapril (VASOTEC) 5 MG tablet Take 1 tablet (5 mg total) by mouth daily.  Marland Kitchen estradiol (ESTRACE) 1 MG tablet Take 1 tablet (1 mg total) by mouth daily. (Patient taking differently: Take 0.5 mg by mouth daily. )  . hydrochlorothiazide (HYDRODIURIL) 25 MG tablet Take 1 tablet (25 mg total) by mouth daily.  . valACYclovir (VALTREX) 1000 MG tablet Take two pills twice a day as directed      Review of systems complete and found to be negative unless listed above in HPI    Physical exam Blood pressure 121/65, pulse (!) 56, height 5\' 1"  (1.549 m), weight 179 lb 6.4 oz (81.4 kg), SpO2 98 %. General: NAD Neck: No JVD, no thyromegaly or thyroid nodule.  Lungs: Clear to auscultation bilaterally with normal respiratory effort. CV: Nondisplaced PMI.  Bradycardic, regular rhythm, normal S1/S2, no S3/S4, no murmur.  No peripheral edema.  No carotid bruit.   Abdomen: Soft, nontender, no distention.  Skin: Intact without lesions or rashes.  Neurologic: Alert and oriented x 3.  Psych: Normal affect. Extremities: No clubbing or cyanosis.  HEENT: Normal.   ECG: Most recent ECG reviewed.   Labs: Lab Results  Component Value Date/Time   K 4.3 07/04/2017 09:51 AM   BUN 16 07/04/2017 09:51 AM   CREATININE 0.66 07/04/2017 09:51 AM   HGB 14.6 09/02/2008 12:12 PM     Lipids: No results found for: LDLCALC, LDLDIRECT, CHOL, TRIG, HDL       ASSESSMENT AND PLAN:  1.  Sinus bradycardia: She has asymptomatic sinus bradycardia.  She is not on AV nodal blocking agents.  She has no thyroid disease.  She does not appear to have sleep apnea either.  This may continue indefinitely.  I did inform her that should she develop symptoms such as lightheadedness, dizziness, and shortness of breath, to come back and see me sooner as cardiac testing would be indicated at that time.  For  now, I think routine monitoring by her PCP is indicated.  2.  Prolonged QT interval: ECG is reviewed above.  Citalopram is known to prolong the QT interval.  One could consider an alternative medication.  She currently denies dizziness, palpitations, and syncope.  No cardiac testing is indicated at this time.  3.  Hypertension: Controlled on present therapy which includes enalapril and HIDA chlorothiazide.  No changes.   Disposition: Follow up prn  Signed: Prentice Docker, M.D., F.A.C.C.  08/06/2017, 8:47 AM

## 2017-08-21 ENCOUNTER — Ambulatory Visit: Payer: BLUE CROSS/BLUE SHIELD | Admitting: Internal Medicine

## 2017-08-21 ENCOUNTER — Encounter: Payer: Self-pay | Admitting: Internal Medicine

## 2017-08-21 VITALS — BP 128/73 | HR 62 | Temp 97.9°F | Ht 61.0 in | Wt 178.8 lb

## 2017-08-21 DIAGNOSIS — Z800 Family history of malignant neoplasm of digestive organs: Secondary | ICD-10-CM

## 2017-08-21 DIAGNOSIS — Z8619 Personal history of other infectious and parasitic diseases: Secondary | ICD-10-CM | POA: Diagnosis not present

## 2017-08-21 NOTE — Progress Notes (Signed)
Primary Care Physician:  Aliene BeamsHagler, Rachel, MD Primary Gastroenterologist:  Dr. Jena Gaussourk  Pre-Procedure History & Physical: HPI:  Allison [name] is a 57 y.o. female here for follow-up of a Giardia infection to set up high-risk screening colonoscopy. Treated for Giardia the first of this year. She's had more or less resolution in her symptoms. Family history significant in that her sister was diagnosed with colorectal cancer at age 57. This nice lady had a negative colonoscopy by me back about 5 years ago. She is due for surveillance this time. Currently she does not have any lower GI tract symptoms.   Past Medical History:  Diagnosis Date  . Depression   . Hypercholesteremia   . Hypertension     Past Surgical History:  Procedure Laterality Date  . ABDOMINAL HYSTERECTOMY     total hysterectomy with ovaries are removed.   . ADENOIDECTOMY    . ANKLE CLOSED REDUCTION    . CESAREAN SECTION     X 3  . COLONOSCOPY N/A 04/14/2013   Procedure: COLONOSCOPY;  Surgeon: Corbin Adeobert M Yolani Vo, MD;  [facility];  Service: Endoscopy;  Laterality: N/A;  2:15 PM-moved to 1100 [address] notified pt  . DILATION AND CURETTAGE OF UTERUS    . HEMORROIDECTOMY      Prior to Admission medications   Medication Sig Start Date End Date Taking? Authorizing Provider  ALPRAZolam Prudy Feeler(XANAX) 0.5 MG tablet Take 1 tablet (0.5 mg total) by mouth at bedtime as needed for anxiety. 06/07/17  Yes Hagler, Fleet Contrasachel, MD  citalopram (CELEXA) 20 MG tablet Take 30 mg by mouth daily.    Yes [provider]  enalapril (VASOTEC) 5 MG tablet Take 1 tablet (5 mg total) by mouth daily. 06/07/17  Yes Hagler, Fleet Contrasachel, MD  estradiol (ESTRACE) 1 MG tablet Take 1 tablet (1 mg total) by mouth daily. Patient taking differently: Take 0.5 mg by mouth daily.  10/11/16  Yes Tilda BurrowFerguson, John V, MD  hydrochlorothiazide (HYDRODIURIL) 25 MG tablet Take 1 tablet (25 mg total) by mouth daily. 06/07/17  Yes Hagler, Fleet Contrasachel, MD  Omega-3 Fatty Acids  (FISH OIL PO) Take by mouth daily.   Yes [provider]  valACYclovir (VALTREX) 1000 MG tablet Take two pills twice a day as directed Patient taking differently: Take two pills twice a day as directed as needed 06/07/17  Yes Aliene BeamsHagler, Rachel, MD    Allergies as of 08/21/2017  . (No Known Allergies)    Family History  Problem Relation Age of Onset  . Colon cancer Sister   . Hypertension Sister   . Diabetes Sister   . Diabetes Brother   . Hypertension Brother   . Thyroid cancer Brother   . Alzheimer's disease Mother   . Heart attack Father   . Hypertension Brother     Social History   Socioeconomic History  . Marital status: Married    Spouse name: Allison [name]  . Number of children: 3  . Years of education: Not on file  . Highest education level: Not on file  Occupational History  . Not on file  Social Needs  . Financial resource strain: Not hard at all  . Food insecurity:    Worry: Never true    Inability: Never true  . Transportation needs:    Medical: No    Non-medical: No  Tobacco Use  . Smoking status: Never Smoker  . Smokeless tobacco: Never Used  Substance and Sexual Activity  . Alcohol use: No  Alcohol/week: 0.0 oz  . Drug use: No  . Sexual activity: Yes    Partners: Male    Birth control/protection: Surgical  Lifestyle  . Physical activity:    Days per week: Not on file    Minutes per session: Not on file  . Stress: Not on file  Relationships  . Social connections:    Talks on phone: Not on file    Gets together: Not on file    Attends religious service: Not on file    Active member of club or organization: Not on file    Attends meetings of clubs or organizations: Not on file    Relationship status: Not on file  . Intimate partner violence:    Fear of current or ex partner: Not on file    Emotionally abused: Not on file    Physically abused: Not on file    Forced sexual activity: Not on file  Other Topics Concern  . Not on file  Social  History Narrative   Lives in [place]. Married for 39 years. 3 children. 2 grandchildren. Has 6 siblings. Mother is decreased.    Attends church. [employer].   Eats all food groups.   Wear seatbelt.    Review of Systems: See HPI, otherwise negative ROS  Physical Exam: BP 128/73   Pulse 62   Temp 97.9 F (36.6 C) (Oral)   Ht 5\' 1"  (1.549 m)   Wt 178 lb 12.8 oz (81.1 kg)   BMI 33.78 kg/m  General:   Alert,  Well-developed, well-nourished, pleasant and cooperative in NAD Neck:  Supple; no masses or thyromegaly. No significant cervical adenopathy. Lungs:  Clear throughout to auscultation.   No wheezes, crackles, or rhonchi. No acute distress. Heart:  Regular rate and rhythm; no murmurs, clicks, rubs,  or gallops. Abdomen: Non-distended, normal bowel sounds.  Soft and nontender without appreciable mass or hepatosplenomegaly.  Pulses:  Normal pulses noted. Extremities:  Without clubbing or edema.  Impression:   Pleasant 57 year old lady with a positive family history colon cancer young age and for screw relative. She is due for high-risk screening colonoscopy at this time. Recent Giardia infection resolved with treatment.  Recommendations:  I have offered the patient a screening colonoscopy with conscious sedation in the near future.  The risks, benefits, limitations, alternatives and imponderables have been reviewed with the patient. Questions have been answered. All parties are agreeable.   Further recommendations to follow.    Notice: This dictation was prepared with Dragon dictation along with smaller phrase technology. Any transcriptional errors that result from this process are unintentional and may not be corrected upon review.

## 2017-08-21 NOTE — Patient Instructions (Signed)
Schedule a screening colonoscopy - high risk - positive family hx CRC  Conscious sedation  - split prep  Further recommendations to follow

## 2017-08-22 ENCOUNTER — Other Ambulatory Visit: Payer: Self-pay

## 2017-08-22 ENCOUNTER — Telehealth: Payer: Self-pay

## 2017-08-22 DIAGNOSIS — Z1211 Encounter for screening for malignant neoplasm of colon: Secondary | ICD-10-CM

## 2017-08-22 DIAGNOSIS — Z800 Family history of malignant neoplasm of digestive organs: Secondary | ICD-10-CM

## 2017-08-22 MED ORDER — CLENPIQ 10-3.5-12 MG-GM -GM/160ML PO SOLN: 1.0000 | Freq: Once | ORAL | 0 refills | Status: AC

## 2017-08-22 NOTE — Telephone Encounter (Signed)
Pt called office to scheduled TCS with RMR. TCS scheduled for 10/23/17 at 11:15am. Rx for prep sent to pharmacy. She was given Clenpiq coupon yesterday at [facility]. Instructions mailed. Orders entered.

## 2017-08-27 ENCOUNTER — Telehealth: Payer: Self-pay | Admitting: Family Medicine

## 2017-08-27 NOTE — Telephone Encounter (Signed)
Patient left message on nurse line stating that Dr. Tracie HarrierHagler told her to call back if she wanted her Celexa increased. She does want it increased. [facility]

## 2017-08-28 MED ORDER — CITALOPRAM HYDROBROMIDE 40 MG PO TABS: 40.0000 mg | ORAL_TABLET | Freq: Every day | ORAL | 3 refills | Status: DC

## 2017-08-28 NOTE — Telephone Encounter (Signed)
Drug Details Fill Date Drug Qty Days Prescriber Pharm Refill MgEq MgEq/Day  Close Prescriber Details Name [place]  Close Summary   Summary  Total Prescriptions: 5  Total Prescribers: 5  Total Pharmacies: 1  Narcotics*  (excluding buprenorphine) Current Qty: 0  Current MME/day: 0.00  30 Day Avg MME/day: 0.00  Sedatives*  Current Qty: 0  Current LME/day: 0.00  30 Day Avg LME/day: 0.00  Buprenorphine*  Current Qty: 0  Current mg/day: 0.00  30 Day Avg mg/day: 0.00  Rx Data   PRESCRIPTIONS Total Prescriptions: 5  Total Private Pay: 2  Fill Date ID Written Drug Qty Days Prescriber Rx # Pharmacy Refill Daily Dose * Pymt Type PMP  06/07/2017  1  06/07/2017  Alprazolam 0.5 Mg Tablet  30 30 Ra Hag  [id number]  [place] ([id number])  0 1.00 LME Comm Ins  [place]  11/21/2016  1  11/21/2016  Hydrocodone-Acetamin 5-325 Mg  30 5 Ju Oll  [id number]  [place] ([id number])  0 30.00 MME Private Pay  [place]  11/12/2016  1  11/12/2016  Oxycodone-Acetaminophen 5-325  11 3 Co Mac  [id number]  [place] ([id number])  0 27.50 MME Private Pay  [place]  09/04/2016  1  09/04/2016  Alprazolam 1 Mg Tablet  90 30 Ta And  [id number]  [place] ([id number])  0 6.00 LME Comm Ins  [place]  12/14/2015  1  12/14/2015  Alprazolam 1 Mg Tablet  90 30 Jo Hal  [id number]  [place] ([id number])  0 6.00 LME Comm Ins  [place]  *Per CDC guidance, the MME conversion factors prescribed or provided as part of the medication-assisted treatment for opioid use disorder should not be used to benchmark against dosage thresholds meant for opioids prescribed for pain. Buprenorphine products have no agreed upon morphine equivalency, and as partial opioid agonists, are not expected to be associated with overdose risk in the same dose-dependent manner as doses for full agonist opioids. MME = morphine milligram equivalents. LME = Lorazepam milligram equivalents. mg = dose in milligrams.  Providers Total Providers: 5  Name [place] Phone  Selina Cooley  [place] [address] -  Rachel [address], [address] [place] [address] -  Courteney Mackuen  [address] [address] [place] [address] -  Justin P Ollis  [address] [place] [address] -  John Z Hall  [place] [address] -  Pharmacies Total Pharmacies: 1  Name [place] Phone  [facility], L.L.C. [phone]) [place] [address] -

## 2017-08-28 NOTE — Telephone Encounter (Signed)
Please advised that at this time we will go ahead and increase Celexa to 40 mg, 1 p.o. daily.  Please asked to follow-up within the next month.  I have sent the medication in for refill to her pharmacy.

## 2017-08-28 NOTE — Telephone Encounter (Signed)
Called patient regarding message below. No answer, left generic message for patient to return call.   

## 2017-08-28 NOTE — Telephone Encounter (Signed)
Will you pull up her prescription refills for xanax and send to me. Allison Limboachel H. Tracie HarrierHagler, MD

## 2017-08-30 NOTE — Telephone Encounter (Signed)
Patient informed of message below, verbalized understanding. Appointment scheduled.

## 2017-08-30 NOTE — Telephone Encounter (Signed)
Called patient regarding message below. No answer, left generic message for patient to return call.   

## 2017-09-03 ENCOUNTER — Other Ambulatory Visit: Payer: Self-pay | Admitting: Family Medicine

## 2017-09-03 DIAGNOSIS — I10 Essential (primary) hypertension: Secondary | ICD-10-CM

## 2017-10-05 ENCOUNTER — Ambulatory Visit (INDEPENDENT_AMBULATORY_CARE_PROVIDER_SITE_OTHER): Payer: BLUE CROSS/BLUE SHIELD | Admitting: Family Medicine

## 2017-10-05 ENCOUNTER — Other Ambulatory Visit: Payer: Self-pay

## 2017-10-05 ENCOUNTER — Encounter: Payer: Self-pay | Admitting: Family Medicine

## 2017-10-05 VITALS — BP 140/80 | HR 67 | Temp 98.3°F | Resp 14 | Ht 61.0 in | Wt 176.1 lb

## 2017-10-05 DIAGNOSIS — I10 Essential (primary) hypertension: Secondary | ICD-10-CM

## 2017-10-05 DIAGNOSIS — Z114 Encounter for screening for human immunodeficiency virus [HIV]: Secondary | ICD-10-CM | POA: Diagnosis not present

## 2017-10-05 DIAGNOSIS — F419 Anxiety disorder, unspecified: Secondary | ICD-10-CM

## 2017-10-05 DIAGNOSIS — Z79899 Other long term (current) drug therapy: Secondary | ICD-10-CM

## 2017-10-05 MED ORDER — CITALOPRAM HYDROBROMIDE 40 MG PO TABS: 40.0000 mg | ORAL_TABLET | Freq: Every day | ORAL | 1 refills | Status: DC

## 2017-10-05 NOTE — Patient Instructions (Signed)
Come back to the lab for electrolytes/kidney test in 12/2017 Follow up in 04/2018

## 2017-10-05 NOTE — Progress Notes (Signed)
Patient ID: Allison [name], female    DOB: 1961-04-26, 57 y.o.   MRN: [id number]  Chief Complaint  Patient presents with  . Medication Management    celexa follow up    Allergies Patient has no known allergies.  Subjective:   Allison [name] is a 57 y.o. female who presents to [facility] today.  HPI Allison [name] presents today for follow-up of her Celexa and her blood pressure.  She reports that she has been doing well.  Feels much better on her increased dose of Celexa.  Taking the Celexa 40 mg p.o. daily.  Feels like her anxiety is more controlled.  Feels like she does not feel as down or sad.  Reports her appetite is good.  Concentration is good.  Energy level is good.  No side effects with increased dose.  Has some issues that she feels like the medication helps her to deal with.  Reports that when these feelings surface her and her husband pray and she talks with him.  She denies any suicidal or homicidal ideation.  Denies any auditory or visual hallucination.  Attends church.  Doing some exercise.  Reports that has not been as active now in the evenings because is tired when she comes home from work.  Reports that she works in a [employer] and there is no air conditioning and the job takes a lot out of her. She is drinking a lot of water during the day.  Denies any chest pain, shortness of breath, swelling in her extremities.  Did not take her blood pressure medication this morning.  She was last here she had been using the Xanax 3-4 nights a week.  After our discussion she is now cut down on the use to where she is only using it approximately once a week or less.  He is sleeping better.  Reports she only uses the Xanax if she absolutely cannot get to sleep and her brain is racing and feels anxious. Not yet been to Dr. Emelda Fear to get her Pap and pelvic exam done. Had previously deferred getting an HIV test but reports that when she comes back for labs in August she will  get it done.   Past Medical History:  Diagnosis Date  . Depression   . Hypercholesteremia   . Hypertension     Past Surgical History:  Procedure Laterality Date  . ABDOMINAL HYSTERECTOMY     total hysterectomy with ovaries are removed.   . ADENOIDECTOMY    . ANKLE CLOSED REDUCTION    . CESAREAN SECTION     X 3  . COLONOSCOPY N/A 04/14/2013   Procedure: COLONOSCOPY;  Surgeon: Corbin Ade, MD;  [facility];  Service: Endoscopy;  Laterality: N/A;  2:15 PM-moved to [address] notified pt  . DILATION AND CURETTAGE OF UTERUS    . HEMORROIDECTOMY      Family History  Problem Relation Age of Onset  . Colon cancer Sister   . Hypertension Sister   . Diabetes Sister   . Diabetes Brother   . Hypertension Brother   . Thyroid cancer Brother   . Alzheimer's disease Mother   . Heart attack Father   . Hypertension Brother      Social History   Socioeconomic History  . Marital status: Married    Spouse name: Loraine Leriche  . Number of children: 3  . Years of education: Not on file  . Highest education level: Not  on file  Occupational History  . Not on file  Social Needs  . Financial resource strain: Not hard at all  . Food insecurity:    Worry: Never true    Inability: Never true  . Transportation needs:    Medical: No    Non-medical: No  Tobacco Use  . Smoking status: Never Smoker  . Smokeless tobacco: Never Used  Substance and Sexual Activity  . Alcohol use: No    Alcohol/week: 0.0 oz  . Drug use: No  . Sexual activity: Yes    Partners: Male    Birth control/protection: Surgical  Lifestyle  . Physical activity:    Days per week: Not on file    Minutes per session: Not on file  . Stress: Not on file  Relationships  . Social connections:    Talks on phone: Not on file    Gets together: Not on file    Attends religious service: Not on file    Active member of club or organization: Not on file    Attends meetings of clubs or organizations: Not on file      Relationship status: Not on file  Other Topics Concern  . Not on file  Social History Narrative   Lives in [place]. Married for 39 years. 3 children. 2 grandchildren. Has 6 siblings. Mother is decreased.    Attends church. [employer]   Eats all food groups.   Wear seatbelt.   Works at [employer] in [employer].      Current Outpatient Medications on File Prior to Visit  Medication Sig Dispense Refill  . ALPRAZolam (XANAX) 0.5 MG tablet Take 1 tablet (0.5 mg total) by mouth at bedtime as needed for anxiety. 30 tablet 0  . enalapril (VASOTEC) 5 MG tablet TAKE 1 TABLET BY MOUTH EVERY DAY 90 tablet 0  . estradiol (ESTRACE) 1 MG tablet Take 1 tablet (1 mg total) by mouth daily. (Patient taking differently: Take 0.5 mg by mouth daily. ) 30 tablet 11  . hydrochlorothiazide (HYDRODIURIL) 25 MG tablet Take 1 tablet (25 mg total) by mouth daily. 90 tablet 1  . Omega-3 Fatty Acids (FISH OIL PO) Take by mouth daily.    . valACYclovir (VALTREX) 1000 MG tablet Take two pills twice a day as directed (Patient taking differently: Take two pills twice a day as directed as needed) 20 tablet 0   No current facility-administered medications on file prior to visit.     Review of Systems  Constitutional: Negative for activity change, appetite change and fever.  Eyes: Negative for visual disturbance.  Respiratory: Negative for cough, chest tightness and shortness of breath.   Cardiovascular: Negative for chest pain, palpitations and leg swelling.  Gastrointestinal: Negative for abdominal pain, nausea and vomiting.  Genitourinary: Negative for dysuria, frequency and urgency.  Musculoskeletal: Negative for back pain and neck pain.  Skin: Negative for rash.  Neurological: Negative for dizziness, syncope and light-headedness.  Hematological: Negative for adenopathy.  Psychiatric/Behavioral: Negative for agitation, behavioral problems, confusion, dysphoric mood, hallucinations,  self-injury, sleep disturbance and suicidal ideas. The patient is not nervous/anxious and is not hyperactive.      Objective:   BP 140/80 (BP Location: Right Arm, Patient Position: Sitting, Cuff Size: Large)   Pulse 67   Temp 98.3 F (36.8 C) (Temporal)   Resp 14   Ht 5\' 1"  (1.549 m)   Wt 176 lb 1.3 oz (79.9 kg)   SpO2 96%   BMI 33.27 kg/m  Physical Exam  Constitutional: She is oriented to person, place, and time. She appears well-developed and well-nourished. No distress.  HENT:  Head: Normocephalic and atraumatic.  Eyes: Pupils are equal, round, and reactive to light. Conjunctivae are normal.  Neck: Normal range of motion. Neck supple. No JVD present. No tracheal deviation present. No thyromegaly present.  Cardiovascular: Normal rate, regular rhythm and normal heart sounds.  Pulmonary/Chest: Effort normal and breath sounds normal. No respiratory distress.  Lymphadenopathy:    She has no cervical adenopathy.  Neurological: She is alert and oriented to person, place, and time. No cranial nerve deficit.  Skin: Skin is warm and dry. Capillary refill takes less than 2 seconds.  Psychiatric: She has a normal mood and affect. Her behavior is normal. Judgment and thought content normal.  Nursing note and vitals reviewed.   [facility] 10/05/2017 06/07/2017  Decreased Interest 1 0  Down, Depressed, Hopeless 0 0  PHQ - 2 Score 1 0    Assessment and Plan  1. Anxiety Improved.  Continue Celexa 40 mg p.o. Daily. Patient counseled in detail regarding the risks of medication. Told to call or return to clinic if develop any worrisome signs or symptoms. Patient voiced understanding.  Defers therapy.  Active in church and has family support when anxiety arises. - citalopram (CELEXA) 40 MG tablet; Take 1 tablet (40 mg total) by mouth daily.  Dispense: 90 tablet; Refill: 1 May continue use of Xanax as needed for anxiety/anxiety causing sleep disturbance.  Understands risks,  addictive nature, and sedation precautions. Patient agrees to not escalate dose to more than once every 7 to 10 days. 2. High risk medication use Return to clinic in 3 months to check due to BP medications.  - Basic metabolic panel  3. Screening for HIV (human immunodeficiency virus) Patient previously deferred.  Agrees to testing at this time.  Will get when she returns to clinic for lab. - HIV antibody  4.  Hypertension Lifestyle modifications discussed with patient including a diet emphasizing vegetables, fruits, and whole grains. Limiting intake of sodium to less than 2,400 mg per day.  Recommendations discussed include consuming low-fat dairy products, poultry, fish, legumes, non-tropical vegetable oils, and nuts; and limiting intake of sweets, sugar-sweetened beverages, and red meat. Discussed following a plan such as the Dietary Approaches to Stop Hypertension (DASH) diet. Patient to read up on this diet.  Continue current medications as directed Return in about 3 months (around 01/05/2018). Aliene Beamsachel Joniya Boberg, MD 10/05/2017

## 2017-10-11 ENCOUNTER — Encounter: Payer: Self-pay | Admitting: Family Medicine

## 2017-10-12 ENCOUNTER — Encounter: Payer: Self-pay | Admitting: Family Medicine

## 2017-10-15 ENCOUNTER — Other Ambulatory Visit: Payer: Self-pay | Admitting: Obstetrics and Gynecology

## 2017-10-15 NOTE — Telephone Encounter (Signed)
refil x 7

## 2017-10-18 ENCOUNTER — Telehealth: Payer: Self-pay | Admitting: Internal Medicine

## 2017-10-18 NOTE — Telephone Encounter (Signed)
Pt returned call. Pt is having her procedure 10/23/17 and has to start taking her prep on 10/22/17 during her working hours. Pt would like to leave work at the time she is suppose to start her prep and return to work the next day after her procedure. Pt needs to provide a letter to her job so she will not get points against her. Letter is ready for pickup.

## 2017-10-18 NOTE — Telephone Encounter (Signed)
Lm with pts spouse. Waiting on a return call.

## 2017-10-18 NOTE — Telephone Encounter (Signed)
PLEASE CALL PATIENT SHE SAID RMR TOLD HER HE DID NOT WANT HER GOING TO WORK THE NEXT DAY FOLLOWING HER PROCEDURE ( ALSO SAID SHE COULD BE MISTAKEN) AND WANTS TO KNOW ABOUT GETTING A DOCTORS NOTE

## 2017-10-23 ENCOUNTER — Encounter (HOSPITAL_COMMUNITY): Payer: Self-pay | Admitting: *Deleted

## 2017-10-23 ENCOUNTER — Ambulatory Visit (HOSPITAL_COMMUNITY)
Admission: RE | Admit: 2017-10-23 | Discharge: 2017-10-23 | Disposition: A | Discharge status: Home / Self Care | Payer: BLUE CROSS/BLUE SHIELD | Source: Ambulatory Visit | Attending: Internal Medicine | Admitting: Internal Medicine

## 2017-10-23 ENCOUNTER — Other Ambulatory Visit: Payer: Self-pay

## 2017-10-23 ENCOUNTER — Encounter (HOSPITAL_COMMUNITY)
Admission: RE | Disposition: A | Discharge status: Home / Self Care | Payer: Self-pay | Source: Ambulatory Visit | Attending: Internal Medicine

## 2017-10-23 DIAGNOSIS — K5730 Diverticulosis of large intestine without perforation or abscess without bleeding: Secondary | ICD-10-CM | POA: Insufficient documentation

## 2017-10-23 DIAGNOSIS — Z79899 Other long term (current) drug therapy: Secondary | ICD-10-CM | POA: Insufficient documentation

## 2017-10-23 DIAGNOSIS — I10 Essential (primary) hypertension: Secondary | ICD-10-CM | POA: Diagnosis not present

## 2017-10-23 DIAGNOSIS — Z1211 Encounter for screening for malignant neoplasm of colon: Secondary | ICD-10-CM | POA: Diagnosis present

## 2017-10-23 DIAGNOSIS — Z800 Family history of malignant neoplasm of digestive organs: Secondary | ICD-10-CM | POA: Insufficient documentation

## 2017-10-23 DIAGNOSIS — Z90710 Acquired absence of both cervix and uterus: Secondary | ICD-10-CM | POA: Diagnosis not present

## 2017-10-23 HISTORY — PX: COLONOSCOPY: SHX5424

## 2017-10-23 HISTORY — DX: Anxiety disorder, unspecified: F41.9

## 2017-10-23 SURGERY — COLONOSCOPY
Anesthesia: Moderate Sedation

## 2017-10-23 MED ORDER — STERILE WATER FOR IRRIGATION IR SOLN
Status: DC | PRN
  Administered 2017-10-23: 11:00:00

## 2017-10-23 MED ORDER — MIDAZOLAM HCL 5 MG/5ML IJ SOLN
INTRAMUSCULAR | Status: DC | PRN
  Administered 2017-10-23 (×2): 2 mg via INTRAVENOUS

## 2017-10-23 MED ORDER — SODIUM CHLORIDE 0.9 % IV SOLN
INTRAVENOUS | Status: DC
  Administered 2017-10-23: 11:00:00 via INTRAVENOUS

## 2017-10-23 MED ORDER — MEPERIDINE HCL 100 MG/ML IJ SOLN
INTRAMUSCULAR | Status: DC | PRN
  Administered 2017-10-23: 25 mg via INTRAVENOUS
  Administered 2017-10-23: 50 mg via INTRAVENOUS

## 2017-10-23 MED ORDER — ONDANSETRON HCL 4 MG/2ML IJ SOLN
INTRAMUSCULAR | Status: DC | PRN
  Administered 2017-10-23: 4 mg via INTRAVENOUS

## 2017-10-23 MED ORDER — MEPERIDINE HCL 100 MG/ML IJ SOLN
INTRAMUSCULAR | Status: DC
Start: 2017-10-23 — End: 2017-10-23
  Filled 2017-10-23: qty 2

## 2017-10-23 MED ORDER — ONDANSETRON HCL 4 MG/2ML IJ SOLN
INTRAMUSCULAR | Status: AC
  Filled 2017-10-23: qty 2

## 2017-10-23 MED ORDER — MIDAZOLAM HCL 5 MG/5ML IJ SOLN
INTRAMUSCULAR | Status: AC
  Filled 2017-10-23: qty 10

## 2017-10-23 NOTE — Discharge Instructions (Signed)
°Colonoscopy °Discharge Instructions ° °Read the instructions outlined below and refer to this sheet in the next few weeks. These discharge instructions provide you with general information on caring for yourself after you leave the hospital. Your doctor may also give you specific instructions. While your treatment has been planned according to the most current medical practices available, unavoidable complications occasionally occur. If you have any problems or questions after discharge, call Dr. Rourk at [phone]. °ACTIVITY °· You may resume your regular activity, but move at a slower pace for the next 24 hours.  °· Take frequent rest periods for the next 24 hours.  °· Walking will help get rid of the air and reduce the bloated feeling in your belly (abdomen).  °· No driving for 24 hours (because of the medicine (anesthesia) used during the test).   °· Do not sign any important legal documents or operate any machinery for 24 hours (because of the anesthesia used during the test).  °NUTRITION °· Drink plenty of fluids.  °· You may resume your normal diet as instructed by your doctor.  °· Begin with a light meal and progress to your normal diet. Heavy or fried foods are harder to digest and may make you feel sick to your stomach (nauseated).  °· Avoid alcoholic beverages for 24 hours or as instructed.  °MEDICATIONS °· You may resume your normal medications unless your doctor tells you otherwise.  °WHAT YOU CAN EXPECT TODAY °· Some feelings of bloating in the abdomen.  °· Passage of more gas than usual.  °· Spotting of blood in your stool or on the toilet paper.  °IF YOU HAD POLYPS REMOVED DURING THE COLONOSCOPY: °· No aspirin products for 7 days or as instructed.  °· No alcohol for 7 days or as instructed.  °· Eat a soft diet for the next 24 hours.  °FINDING OUT THE RESULTS OF YOUR TEST °Not all test results are available during your visit. If your test results are not back during the visit, make an appointment  with your caregiver to find out the results. Do not assume everything is normal if you have not heard from your caregiver or the medical facility. It is important for you to follow up on all of your test results.  °SEEK IMMEDIATE MEDICAL ATTENTION IF: °· You have more than a spotting of blood in your stool.  °· Your belly is swollen (abdominal distention).  °· You are nauseated or vomiting.  °· You have a temperature over 101.  °· You have abdominal pain or discomfort that is severe or gets worse throughout the day.  ° ° °Diverticulosis information provided ° °Repeat colonoscopy in 5 years ° ° °Diverticulosis °Diverticulosis is a condition that develops when small pouches (diverticula) form in the wall of the large intestine (colon). The colon is where water is absorbed and stool is formed. The pouches form when the inside layer of the colon pushes through weak spots in the outer layers of the colon. You may have a few pouches or many of them. °What are the causes? °The cause of this condition is not known. °What increases the risk? °The following factors may make you more likely to develop this condition: °· Being older than age 60. Your risk for this condition increases with age. Diverticulosis is rare among people younger than age 30. By age 80, many people have it. °· Eating a low-fiber diet. °· Having frequent constipation. °· Being overweight. °· Not getting enough exercise. °· Smoking. °·   Taking over-the-counter pain medicines, like aspirin and ibuprofen. °· Having a family history of diverticulosis. ° °What are the signs or symptoms? °In most people, there are no symptoms of this condition. If you do have symptoms, they may include: °· Bloating. °· Cramps in the abdomen. °· Constipation or diarrhea. °· Pain in the lower left side of the abdomen. ° °How is this diagnosed? °This condition is most often diagnosed during an exam for other colon problems. Because diverticulosis usually has no symptoms, it often  cannot be diagnosed independently. This condition may be diagnosed by: °· Using a flexible scope to examine the colon (colonoscopy). °· Taking an X-ray of the colon after dye has been put into the colon (barium enema). °· Doing a CT scan. ° °How is this treated? °You may not need treatment for this condition if you have never developed an infection related to diverticulosis. If you have had an infection before, treatment may include: °· Eating a high-fiber diet. This may include eating more fruits, vegetables, and grains. °· Taking a fiber supplement. °· Taking a live bacteria supplement (probiotic). °· Taking medicine to relax your colon. °· Taking antibiotic medicines. ° °Follow these instructions at home: °· Drink 6-8 glasses of water or more each day to prevent constipation. °· Try not to strain when you have a bowel movement. °· If you have had an infection before: °? Eat more fiber as directed by your health care provider or your diet and nutrition specialist (dietitian). °? Take a fiber supplement or probiotic, if your health care provider approves. °· Take over-the-counter and prescription medicines only as told by your health care provider. °· If you were prescribed an antibiotic, take it as told by your health care provider. Do not stop taking the antibiotic even if you start to feel better. °· Keep all follow-up visits as told by your health care provider. This is important. °Contact a health care provider if: °· You have pain in your abdomen. °· You have bloating. °· You have cramps. °· You have not had a bowel movement in 3 days. °Get help right away if: °· Your pain gets worse. °· Your bloating becomes very bad. °· You have a fever or chills, and your symptoms suddenly get worse. °· You vomit. °· You have bowel movements that are bloody or black. °· You have bleeding from your rectum. °Summary °· Diverticulosis is a condition that develops when small pouches (diverticula) form in the wall of the large  intestine (colon). °· You may have a few pouches or many of them. °· This condition is most often diagnosed during an exam for other colon problems. °· If you have had an infection related to diverticulosis, treatment may include increasing the fiber in your diet, taking supplements, or taking medicines. °This information is not intended to replace advice given to you by your health care provider. Make sure you discuss any questions you have with your health care provider. °Document Released: 01/20/2004 Document Revised: 03/13/2016 Document Reviewed: 03/13/2016 °Elsevier Interactive Patient Education © 2017 Elsevier Inc. ° °

## 2017-10-23 NOTE — H&P (Signed)
@LOGO @   Primary Care Physician:  Aliene Beams, MD Primary Gastroenterologist:  Dr. Jena Gauss  Pre-Procedure History & Physical: HPI:  Allison [name] is a 57 y.o. female is here for a screening colonoscopy.   Positive family history of colon cancer at a young age.  Negative colonoscopy 5 years ago.  Past Medical History:  Diagnosis Date  . Anxiety   . Depression   . Hypercholesteremia   . Hypertension     Past Surgical History:  Procedure Laterality Date  . ABDOMINAL HYSTERECTOMY     total hysterectomy with ovaries are removed.   . ADENOIDECTOMY    . ANKLE CLOSED REDUCTION    . CESAREAN SECTION     X 3  . COLONOSCOPY N/A 04/14/2013   Procedure: COLONOSCOPY;  Surgeon: Corbin Ade, MD;  [facility];  Service: Endoscopy;  Laterality: N/A;  2:15 PM-moved to [address] notified pt  . DILATION AND CURETTAGE OF UTERUS    . HEMORROIDECTOMY      Prior to Admission medications   Medication Sig Start Date End Date Taking? Authorizing Provider  ALPRAZolam Prudy Feeler) 0.5 MG tablet Take 1 tablet (0.5 mg total) by mouth at bedtime as needed for anxiety. 06/07/17  Yes Hagler, Fleet Contras, MD  citalopram (CELEXA) 40 MG tablet Take 1 tablet (40 mg total) by mouth daily. 10/05/17  Yes Hagler, Fleet Contras, MD  enalapril (VASOTEC) 5 MG tablet TAKE 1 TABLET BY MOUTH EVERY DAY 09/03/17  Yes Hagler, Fleet Contras, MD  estradiol (ESTRACE) 1 MG tablet TAKE 1 TABLET BY MOUTH EVERY DAY Patient taking differently: Take 0.5 mg by mouth daily 10/15/17  Yes Tilda Burrow, MD  hydrochlorothiazide (HYDRODIURIL) 25 MG tablet Take 1 tablet (25 mg total) by mouth daily. 06/07/17  Yes Hagler, Fleet Contras, MD  Omega-3 Fatty Acids (FISH OIL PO) Take 1 capsule by mouth daily.    Yes [provider]  Melatonin 5 MG CAPS Take 5 mg by mouth at bedtime as needed (for sleep).    [provider]  Tetrahydrozoline HCl (VISINE OP) Place 1 drop into both eyes daily as needed (for dry eyes).    [provider]   valACYclovir (VALTREX) 1000 MG tablet Take two pills twice a day as directed Patient taking differently: Take 2,000 mg by mouth 2 (two) times daily as needed (for outbreak).  06/07/17   Aliene Beams, MD    Allergies as of 08/22/2017  . (No Known Allergies)    Family History  Problem Relation Age of Onset  . Colon cancer Sister   . Hypertension Sister   . Diabetes Sister   . Diabetes Brother   . Hypertension Brother   . Thyroid cancer Brother   . Alzheimer's disease Mother   . Heart attack Father   . Hypertension Brother     Social History   Socioeconomic History  . Marital status: Married    Spouse name: Loraine Leriche  . Number of children: 3  . Years of education: Not on file  . Highest education level: Not on file  Occupational History  . Not on file  Social Needs  . Financial resource strain: Not hard at all  . Food insecurity:    Worry: Never true    Inability: Never true  . Transportation needs:    Medical: No    Non-medical: No  Tobacco Use  . Smoking status: Never Smoker  . Smokeless tobacco: Never Used  Substance and Sexual Activity  . Alcohol use: No  Alcohol/week: 0.0 oz  . Drug use: No  . Sexual activity: Yes    Partners: Male    Birth control/protection: Surgical  Lifestyle  . Physical activity:    Days per week: Not on file    Minutes per session: Not on file  . Stress: Not on file  Relationships  . Social connections:    Talks on phone: Not on file    Gets together: Not on file    Attends religious service: Not on file    Active member of club or organization: Not on file    Attends meetings of clubs or organizations: Not on file    Relationship status: Not on file  . Intimate partner violence:    Fear of current or ex partner: Not on file    Emotionally abused: Not on file    Physically abused: Not on file    Forced sexual activity: Not on file  Other Topics Concern  . Not on file  Social History Narrative   Lives in [place]. Married  for 39 years. 3 children. 2 grandchildren. Has 6 siblings. Mother is decreased.    Attends church. [employer].   Eats all food groups.   Wear seatbelt.   Works [employer] [place].       Review of Systems: See HPI, otherwise negative ROS  Physical Exam: BP 126/82   Pulse (!) 57   Temp 98.5 F (36.9 C) (Oral)   Resp 15   Ht 5\' 1"  (1.549 m)   Wt 176 lb (79.8 kg)   SpO2 98%   BMI 33.25 kg/m  General:   Alert,  Well-developed, well-nourished, pleasant and cooperative in NAD Lungs:  Clear throughout to auscultation.   No wheezes, crackles, or rhonchi. No acute distress. Heart:  Regular rate and rhythm; no murmurs, clicks, rubs,  or gallops. Abdomen:  Soft, nontender and nondistended. No masses, hepatosplenomegaly or hernias noted. Normal bowel sounds, without guarding, and without rebound.     Impression/Plan: Allison [name] is now here to undergo a screening colonoscopy.  High risk examination.  Risks, benefits, limitations, imponderables and alternatives regarding colonoscopy have been reviewed with the patient. Questions have been answered. All parties agreeable.     Notice:  This dictation was prepared with Dragon dictation along with smaller phrase technology. Any transcriptional errors that result from this process are unintentional and may not be corrected upon review.

## 2017-10-23 NOTE — Op Note (Signed)
[employer] [employer] Patient Name: Allison [name] Procedure Date: 10/23/2017 10:40 AM MRN: [id number] Date of Birth: [birth year] Attending MD: Gennette Pac , MD CSN: [id number] Age: 57 Admit Type: Outpatient Procedure:                Colonoscopy Indications:              Screening in patient at increased risk: Family                            history of 1st-degree relative with colorectal                            cancer before age 22 years Providers:                Gennette Pac, MD, Judee Clara, RN, Dyann Ruddle Referring MD:              Medicines:                Midazolam 4 mg IV, Meperidine 75 mg IV Complications:            No immediate complications. Estimated Blood Loss:     Estimated blood loss: none. Procedure:                Pre-Anesthesia Assessment:                           - Prior to the procedure, a History and Physical                            was performed, and patient medications and                            allergies were reviewed. The patient's tolerance of                            previous anesthesia was also reviewed. The risks                            and benefits of the procedure and the sedation                            options and risks were discussed with the patient.                            All questions were answered, and informed consent                            was obtained. Prior Anticoagulants: The patient has                            taken no previous anticoagulant or antiplatelet  agents. ASA Grade Assessment: II - A patient with                            mild systemic disease. After reviewing the risks                            and benefits, the patient was deemed in                            satisfactory condition to undergo the procedure.                           After obtaining informed consent, the colonoscope                            was passed under direct  vision. Throughout the                            procedure, the patient's blood pressure, pulse, and                            oxygen saturations were monitored continuously. The                            EC-[id number] ([id number]) scope was introduced through                            the anus and advanced to the the cecum, identified                            by appendiceal orifice and ileocecal valve. The                            colonoscopy was performed without difficulty. The                            patient tolerated the procedure well. The quality                            of the bowel preparation was adequate. The                            ileocecal valve, appendiceal orifice, and rectum                            were photographed. The entire colon was well                            visualized. Scope In: 11:12:39 AM Scope Out: 11:23:57 AM Scope Withdrawal Time: 0 hours 7 minutes 35 seconds  Total Procedure Duration: 0 hours 11 minutes 18 seconds  Findings:      The perianal and digital rectal examinations were normal.      Scattered medium-mouthed diverticula were found in the sigmoid colon and  descending colon.      The exam was otherwise without abnormality on direct and retroflexion       views. Impression:               - Diverticulosis in the sigmoid colon and in the                            descending colon.                           - The examination was otherwise normal on direct                            and retroflexion views.                           - No specimens collected. Moderate Sedation:      Moderate (conscious) sedation was administered by the endoscopy nurse       and supervised by the endoscopist. The following parameters were       monitored: oxygen saturation, heart rate, blood pressure, respiratory       rate, EKG, adequacy of pulmonary ventilation, and response to care.       Total physician intraservice time was 15  minutes. Recommendation:           - Patient has a contact number available for                            emergencies. The signs and symptoms of potential                            delayed complications were discussed with the                            patient. Return to normal activities tomorrow.                            Written discharge instructions were provided to the                            patient.                           - Advance diet as tolerated.                           - Continue present medications.                           - Repeat colonoscopy in 5 years for screening                            purposes.                           - Return to GI office (date not yet determined). Procedure Code(s):        --- Professional ---  [id number], Colonoscopy, flexible; diagnostic, including                            collection of specimen(s) by brushing or washing,                            when performed (separate procedure)                           G0500, Moderate sedation services provided by the                            same physician or other qualified health care                            professional performing a gastrointestinal                            endoscopic service that sedation supports,                            requiring the presence of an independent trained                            observer to assist in the monitoring of the                            patient's level of consciousness and physiological                            status; initial 15 minutes of intra-service time;                            patient age 38 years or older (additional time may                            be reported with [id number], as appropriate) Diagnosis Code(s):        --- Professional ---                           Z80.0, Family history of malignant neoplasm of                            digestive organs                           K57.30,  Diverticulosis of large intestine without                            perforation or abscess without bleeding CPT copyright 2017 American Medical Association. All rights reserved. The codes documented in this report are preliminary and upon coder review may  be revised to meet current compliance requirements. Gerrit Friendsobert M. Zionna Homewood, MD Gennette Pacobert Michael Mickal Meno, MD 10/23/2017 11:30:03 AM This report has been signed electronically. Number of Addenda: 0

## 2017-10-29 ENCOUNTER — Encounter (HOSPITAL_COMMUNITY): Payer: Self-pay | Admitting: Internal Medicine

## 2017-11-19 ENCOUNTER — Other Ambulatory Visit: Payer: Self-pay | Admitting: Obstetrics and Gynecology

## 2017-11-20 NOTE — Telephone Encounter (Signed)
rx estradiol x 90 tabs, refil x 3

## 2017-11-28 ENCOUNTER — Other Ambulatory Visit: Payer: Self-pay | Admitting: Family Medicine

## 2017-11-28 DIAGNOSIS — I10 Essential (primary) hypertension: Secondary | ICD-10-CM

## 2017-12-11 ENCOUNTER — Encounter: Payer: Self-pay | Admitting: Family Medicine

## 2017-12-11 DIAGNOSIS — F419 Anxiety disorder, unspecified: Secondary | ICD-10-CM

## 2017-12-11 MED ORDER — ALPRAZOLAM 0.5 MG PO TABS: 0.5000 mg | ORAL_TABLET | Freq: Every evening | ORAL | 0 refills | Status: DC | PRN

## 2018-01-01 ENCOUNTER — Ambulatory Visit: Payer: Self-pay | Admitting: Family Medicine

## 2018-01-03 IMAGING — CT CT ANKLE*L* W/O CM
3 of 4 series · 15 of 33 positions shown, 19 images · non-contrast
Comparison: 11/12/2016 radiographs

CLINICAL DATA: Twisting ankle injury during a fall resulting in
fracture.

EXAM:
CT OF THE LEFT ANKLE WITHOUT CONTRAST
TECHNIQUE: Multidetector CT imaging of the left ankle was performed according
to the standard protocol. Multiplanar CT image reconstructions were
also generated.

[Series 5: lt lower ext 1.5 st · axial · 0.51mm/px · z∈[+15,+230]mm · 9 of 169 slices shown, 12 images]
[im 13/169  soft-tissue]
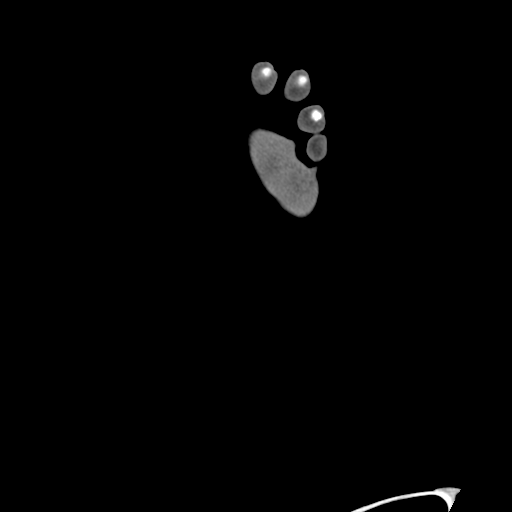
[im 13/169  bone]
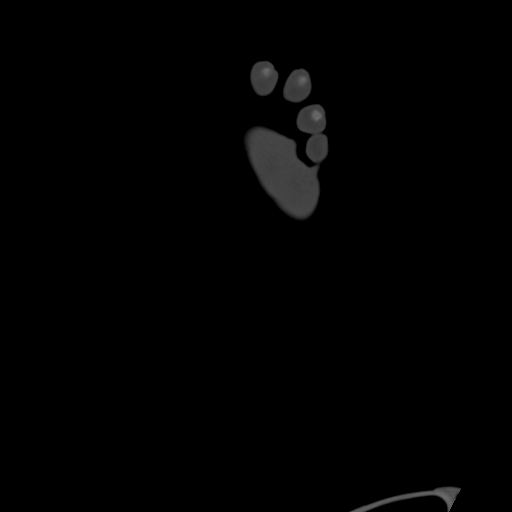
[im 39/169  bone]
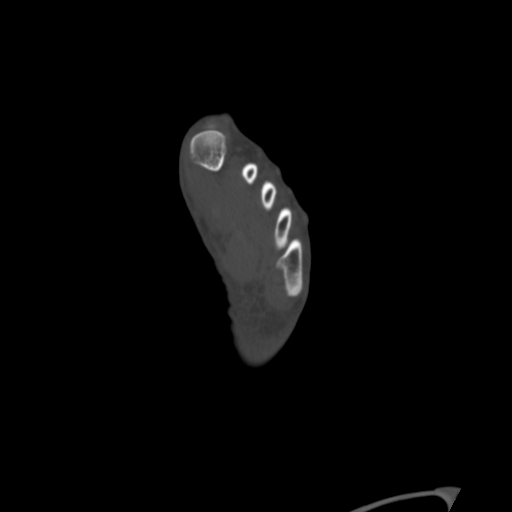
[im 52/169  bone]
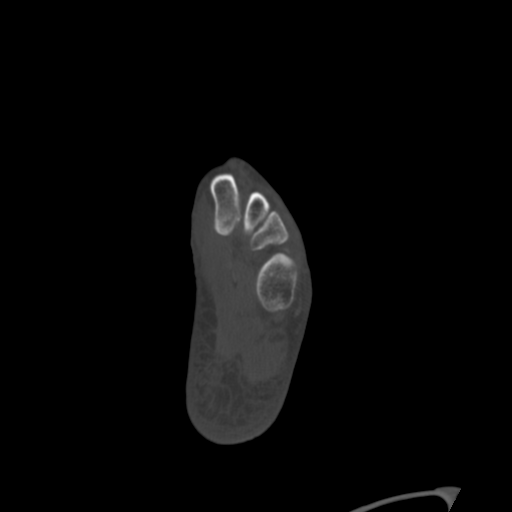
[im 65/169  bone]
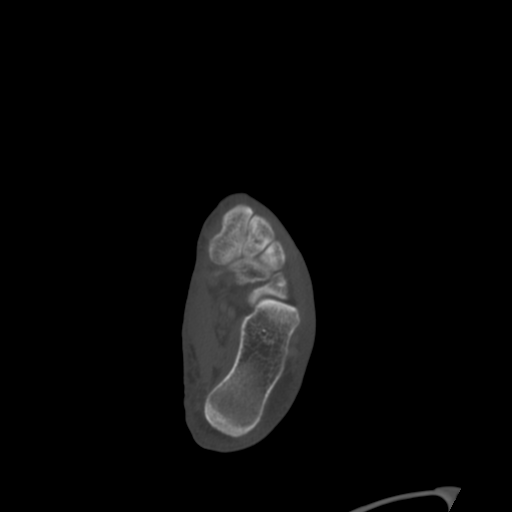
[im 91/169  soft-tissue]
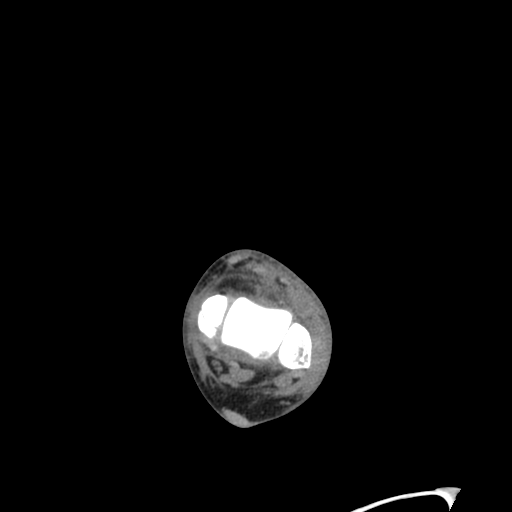
[im 91/169  bone]
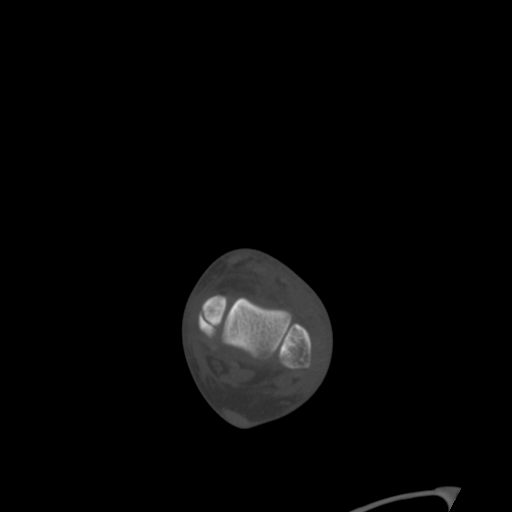
[im 104/169  bone]
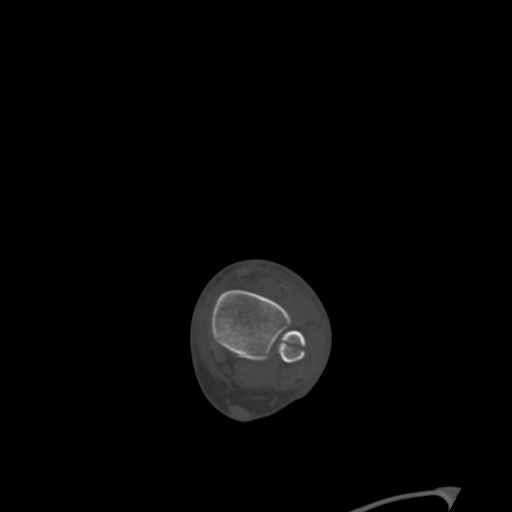
[im 117/169  bone]
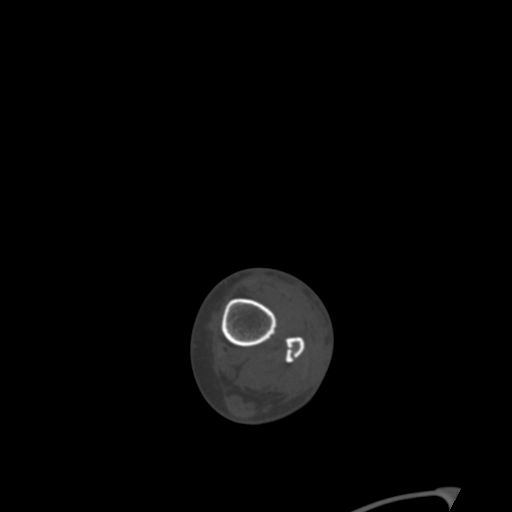
[im 143/169  bone]
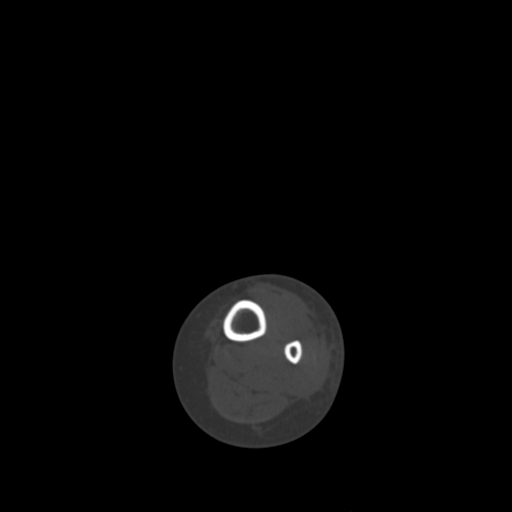
[im 156/169  soft-tissue]
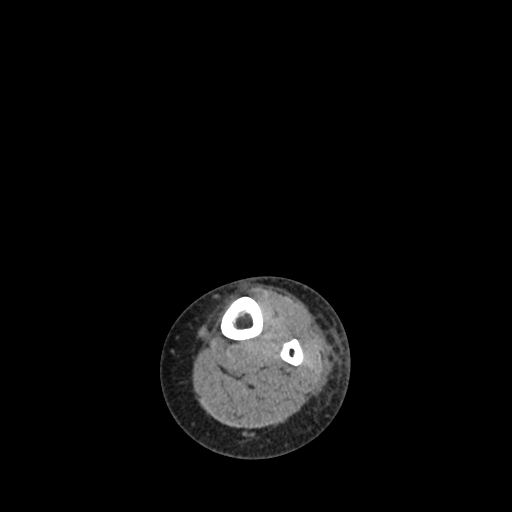
[im 156/169  bone]
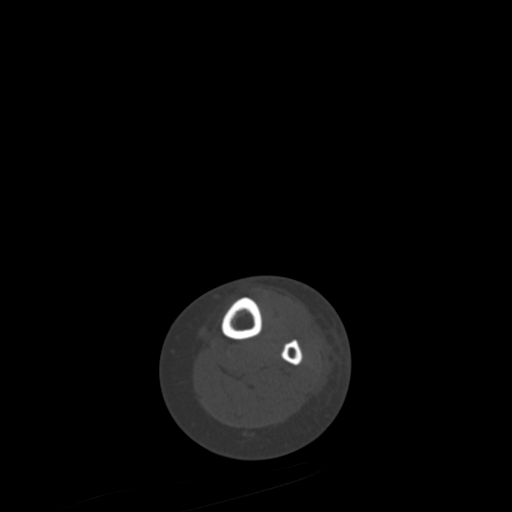

[Series 8: lt lower ext cor bone · coronal · 0.40mm/px · 1 of 159 slices shown]
[im 80/159  bone]
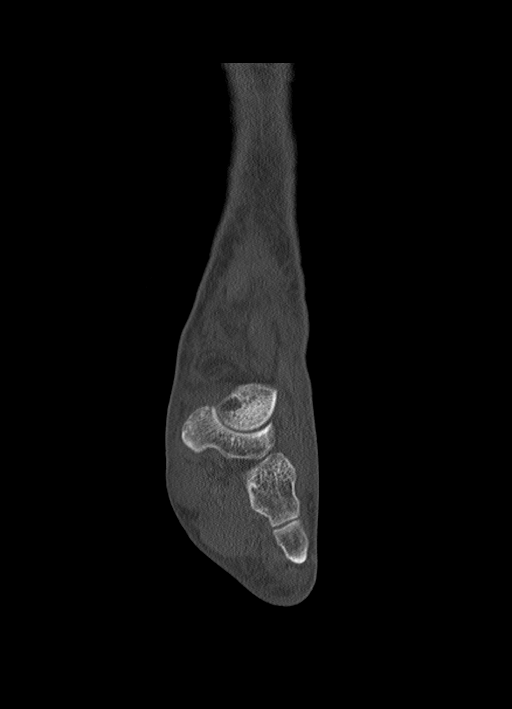

[Series 11: lt lower ext sag st · sagittal · 0.55mm/px · 5 of 75 slices shown, 6 images]
[im 25/75  bone]
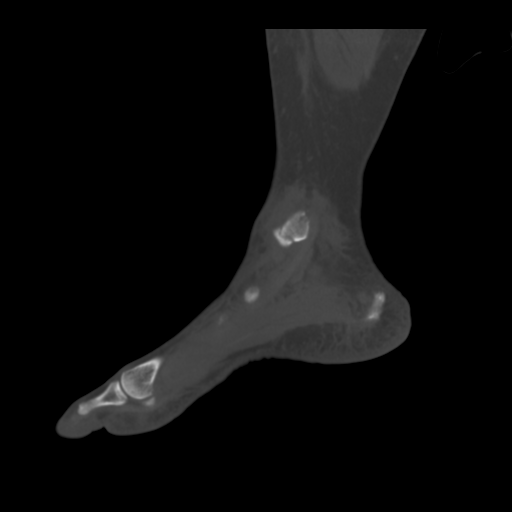
[im 31/75  bone]
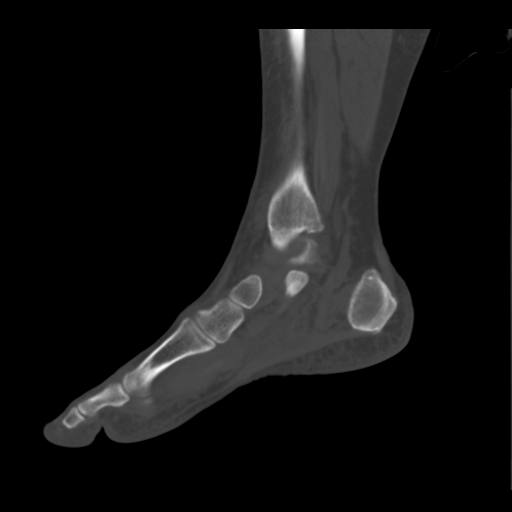
[im 38/75  soft-tissue]
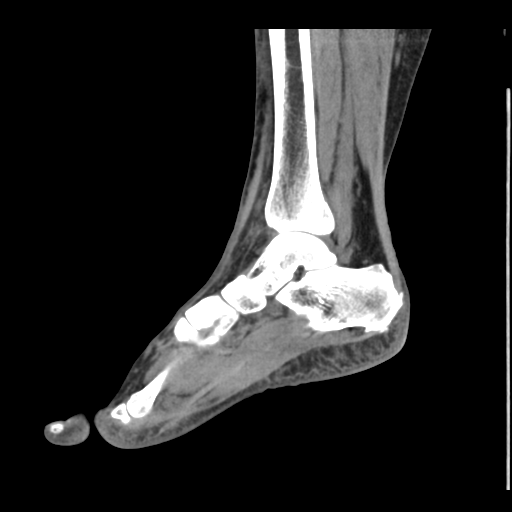
[im 38/75  bone]
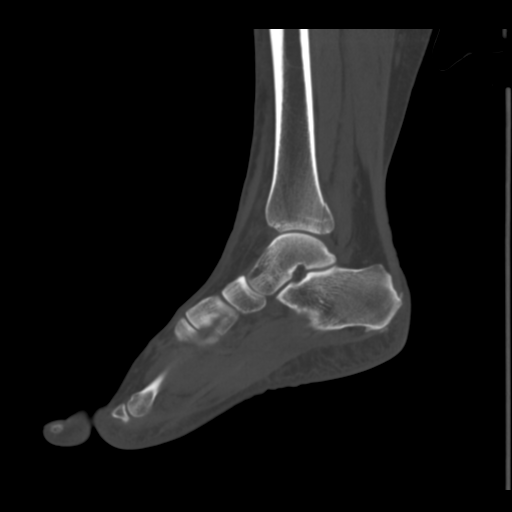
[im 44/75  bone]
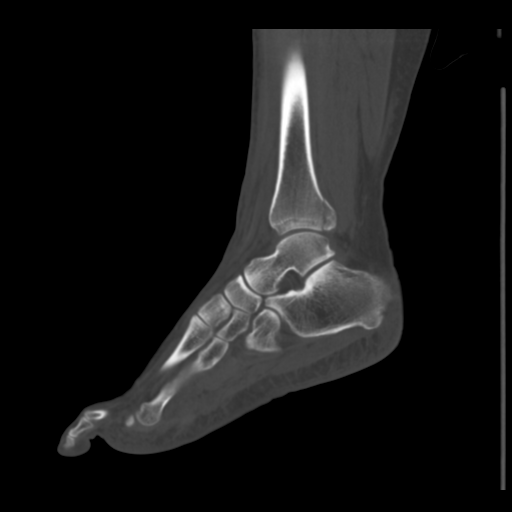
[im 50/75  bone]
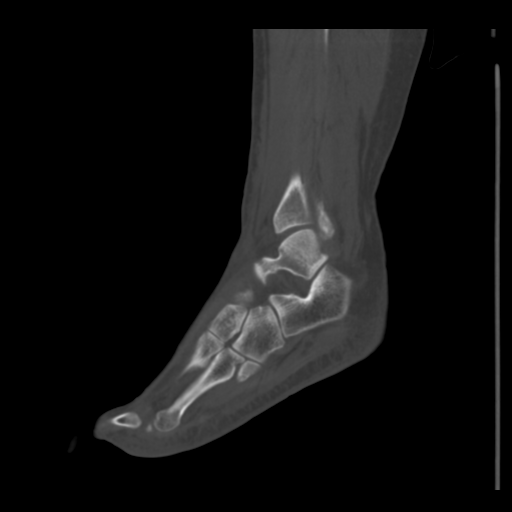

[15 of 33 positions shown; findings below may reference images not displayed]

FINDINGS: Bones/Joint/Cartilage

Stage IV Weber B fracture (Supination -external rotation) including
a relatively nondisplaced oblique fracture lateral malleolus, an
oblique fracture the posterior malleolus, and a transverse (slightly
oblique) fracture of the medial malleolus.

There is some mild widening of the volar-lateral mortise and
narrowing of the medial mortise indicating instability.

Plantar and Achilles calcaneal spurs.

No other fractures identified.

Ligaments

Suboptimally assessed by CT.

Muscles and Tendons

No tendon entrapment.

Soft tissues

Subcutaneous edema along the medial, lateral, and anterior ankle.
Small amount of subcutaneous edema along the heel.
IMPRESSION: 1. Stage IV Weber B fracture (supination -Breaux external rotation) of
the left ankle. Mild widening of the volar-lateral mortise.
Associated regional subcutaneous edema. No flexor tendon entrapment
along the fracture planes.
2. Plantar and Achilles calcaneal spurs.

## 2018-02-20 ENCOUNTER — Encounter: Payer: Self-pay | Admitting: Family Medicine

## 2018-02-20 ENCOUNTER — Ambulatory Visit: Payer: BLUE CROSS/BLUE SHIELD | Admitting: Family Medicine

## 2018-02-20 VITALS — BP 122/81 | HR 61 | Temp 97.7°F | Ht 61.0 in | Wt 174.0 lb

## 2018-02-20 DIAGNOSIS — F411 Generalized anxiety disorder: Secondary | ICD-10-CM | POA: Diagnosis not present

## 2018-02-20 DIAGNOSIS — I10 Essential (primary) hypertension: Secondary | ICD-10-CM | POA: Diagnosis not present

## 2018-02-20 DIAGNOSIS — E785 Hyperlipidemia, unspecified: Secondary | ICD-10-CM | POA: Insufficient documentation

## 2018-02-20 DIAGNOSIS — E669 Obesity, unspecified: Secondary | ICD-10-CM | POA: Diagnosis not present

## 2018-02-20 DIAGNOSIS — M1711 Unilateral primary osteoarthritis, right knee: Secondary | ICD-10-CM

## 2018-02-20 LAB — CMP14+EGFR
ALK PHOS: 61 IU/L (ref 39–117)
ALT: 21 IU/L (ref 0–32)
AST: 20 IU/L (ref 0–40)
Albumin/Globulin Ratio: 1.8 (ref 1.2–2.2)
Albumin: 4.1 g/dL (ref 3.5–5.5)
BUN/Creatinine Ratio: 20 (ref 9–23)
BUN: 14 mg/dL (ref 6–24)
Bilirubin Total: 0.4 mg/dL (ref 0.0–1.2)
CALCIUM: 9.7 mg/dL (ref 8.7–10.2)
CO2: 24 mmol/L (ref 20–29)
CREATININE: 0.7 mg/dL (ref 0.57–1.00)
Chloride: 102 mmol/L (ref 96–106)
GFR calc Af Amer: 111 mL/min/{1.73_m2} (ref >59)
GFR, EST NON AFRICAN AMERICAN: 96 mL/min/{1.73_m2} (ref >59)
GLOBULIN, TOTAL: 2.3 g/dL (ref 1.5–4.5)
GLUCOSE: 81 mg/dL (ref 65–99)
Potassium: 3.9 mmol/L (ref 3.5–5.2)
SODIUM: 141 mmol/L (ref 134–144)
Total Protein: 6.4 g/dL (ref 6.0–8.5)

## 2018-02-20 LAB — CBC WITH DIFFERENTIAL/PLATELET
BASOS ABS: 0.1 10*3/uL (ref 0.0–0.2)
Basos: 1 %
EOS (ABSOLUTE): 0.1 10*3/uL (ref 0.0–0.4)
EOS: 2 %
HEMATOCRIT: 45.1 % (ref 34.0–46.6)
HEMOGLOBIN: 15.1 g/dL (ref 11.1–15.9)
Immature Grans (Abs): 0 10*3/uL (ref 0.0–0.1)
Immature Granulocytes: 0 %
Lymphocytes Absolute: 1.4 10*3/uL (ref 0.7–3.1)
Lymphs: 26 %
MCH: 28.7 pg (ref 26.6–33.0)
MCHC: 33.5 g/dL (ref 31.5–35.7)
MCV: 86 fL (ref 79–97)
MONOCYTES: 7 %
MONOS ABS: 0.4 10*3/uL (ref 0.1–0.9)
Neutrophils Absolute: 3.5 10*3/uL (ref 1.4–7.0)
Neutrophils: 64 %
Platelets: 322 10*3/uL (ref 150–450)
RBC: 5.27 x10E6/uL (ref 3.77–5.28)
RDW: 13.3 % (ref 12.3–15.4)
WBC: 5.4 10*3/uL (ref 3.4–10.8)

## 2018-02-20 LAB — LIPID PANEL
CHOL/HDL RATIO: 3.2 ratio (ref 0.0–4.4)
CHOLESTEROL TOTAL: 222 mg/dL — AB (ref 100–199)
HDL: 69 mg/dL (ref >39)
LDL CALC: 132 mg/dL — AB (ref 0–99)
TRIGLYCERIDES: 107 mg/dL (ref 0–149)
VLDL CHOLESTEROL CAL: 21 mg/dL (ref 5–40)

## 2018-02-20 NOTE — Progress Notes (Signed)
BP 122/81   Pulse 61   Temp 97.7 F (36.5 C) (Oral)   Ht '5\' 1"'  (1.549 m)   Wt 174 lb (78.9 kg)   BMI 32.88 kg/m    Subjective:    Patient ID: Allison [name], female    DOB: 05-02-1961, 57 y.o.   MRN: [id number]  HPI: Allison [name] is a 57 y.o. female presenting on 02/20/2018 for New Patient (Initial Visit) ([facility]); Establish Care; and Knee Pain (Right- x 4-5 months ago and patient states she feels a ball rolling around - on and off)   HPI Hypertension Patient is currently on enalapril and hydrochlorothiazide, and their blood pressure today is 122/81. Patient denies any lightheadedness or dizziness. Patient denies headaches, blurred vision, chest pains, shortness of breath, or weakness. Denies any side effects from medication and is content with current medication.   Hyperlipidemia Patient is coming in for recheck of his hyperlipidemia. The patient is currently taking omega-3. They deny any issues with myalgias or history of liver damage from it. They deny any focal numbness or weakness or chest pain.   Right knee arthritis Patient says that she gets some inflammation and movement feeling under her kneecap and over her kneecap.  She says she gets a little inflammation that she feels like she can actually move around and roll around on her kneecap that comes and goes.  It is not there currently but she does intermittently and that it will go away.  She denies any swelling or fevers or chills or redness or warmth in that knee currently.  She denies any popping or catching or giving away but sometimes it does get somewhat tender mildly when it flared up.  Anxiety Patient comes to establish care with our office for anxiety.  Patient says she takes the alprazolam about 2 times a week and has been using the citalopram 40 mg which was a recent increase which is really helped her feel a lot better and reduce the amount of alprazolam that she needs to take.  She denies any  suicidal ideations or major feelings of depression or anxiety.  She says she does build up stress sometimes but she is been doing really well recently. Depression screen [facility] 2/9 02/20/2018 10/05/2017 06/07/2017  Decreased Interest 0 1 0  Down, Depressed, Hopeless 0 0 0  PHQ - 2 Score 0 1 0     Relevant past medical, surgical, family and social history reviewed and updated as indicated. Interim medical history since our last visit reviewed. Allergies and medications reviewed and updated.  Review of Systems  Constitutional: Negative for chills and fever.  HENT: Negative for ear pain and tinnitus.   Eyes: Negative for pain and visual disturbance.  Respiratory: Negative for cough, chest tightness, shortness of breath and wheezing.   Cardiovascular: Negative for chest pain, palpitations and leg swelling.  Gastrointestinal: Negative for abdominal pain, blood in stool, constipation and diarrhea.  Genitourinary: Negative for dysuria and hematuria.  Musculoskeletal: Negative for back pain, gait problem and myalgias.  Skin: Negative for rash.  Neurological: Negative for dizziness, weakness, light-headedness and headaches.  Psychiatric/Behavioral: Negative for agitation, behavioral problems and suicidal ideas.  All other systems reviewed and are negative.   Per HPI unless specifically indicated above  Social History   Socioeconomic History  . Marital status: Married    Spouse name: Elta Guadeloupe  . Number of children: 3  . Years of education: Not on file  . Highest education level: Not  on file  Occupational History  . Not on file  Social Needs  . Financial resource strain: Not hard at all  . Food insecurity:    Worry: Never true    Inability: Never true  . Transportation needs:    Medical: No    Non-medical: No  Tobacco Use  . Smoking status: Never Smoker  . Smokeless tobacco: Never Used  Substance and Sexual Activity  . Alcohol use: No    Alcohol/week: 0.0 standard drinks  . Drug use:  No  . Sexual activity: Yes    Partners: Male    Birth control/protection: Surgical    Comment: married 39 years  Lifestyle  . Physical activity:    Days per week: Not on file    Minutes per session: Not on file  . Stress: Not on file  Relationships  . Social connections:    Talks on phone: Not on file    Gets together: Not on file    Attends religious service: Not on file    Active member of club or organization: Not on file    Attends meetings of clubs or organizations: Not on file    Relationship status: Not on file  . Intimate partner violence:    Fear of current or ex partner: Not on file    Emotionally abused: Not on file    Physically abused: Not on file    Forced sexual activity: Not on file  Other Topics Concern  . Not on file  Social History Narrative   Lives in [place]. Married for 39 years. 3 children. 2 grandchildren. Has 6 siblings. Mother is decreased.    Attends church. [place] all food groups.   Wear seatbelt.   Works at [employer] in [employer].       Past Surgical History:  Procedure Laterality Date  . ABDOMINAL HYSTERECTOMY     total hysterectomy with ovaries are removed.   . ADENOIDECTOMY    . ANKLE CLOSED REDUCTION    . CESAREAN SECTION     X 3  . COLONOSCOPY N/A 04/14/2013   Procedure: COLONOSCOPY;  Surgeon: Daneil Dolin, MD;  [facility];  Service: Endoscopy;  Laterality: N/A;  2:15 PM-moved to [place] notified pt  . COLONOSCOPY N/A 10/23/2017   Procedure: COLONOSCOPY;  Surgeon: Daneil Dolin, MD;  Location: [facility];  Service: Endoscopy;  Laterality: N/A;  11:15am  . DILATION AND CURETTAGE OF UTERUS    . HEMORROIDECTOMY      Family History  Problem Relation Age of Onset  . Colon cancer Sister   . Hypertension Sister   . Diabetes Sister   . Diabetes Brother   . Hypertension Brother   . Thyroid cancer Brother   . Alzheimer's disease Mother   . Heart attack Father   .  Hypertension Brother     Allergies as of 02/20/2018   No Known Allergies     Medication List        Accurate as of 02/20/18 10:15 AM. Always use your most recent med list.          ALPRAZolam 0.5 MG tablet Commonly known as:  XANAX Take 1 tablet (0.5 mg total) by mouth at bedtime as needed for anxiety.   citalopram 40 MG tablet Commonly known as:  CELEXA Take 1 tablet (40 mg total) by mouth daily.   enalapril 5 MG tablet Commonly known as:  VASOTEC TAKE 1 TABLET  BY MOUTH EVERY DAY   estradiol 1 MG tablet Commonly known as:  ESTRACE TAKE 1 TABLET BY MOUTH EVERY DAY   FISH OIL PO Take 1 capsule by mouth daily.   hydrochlorothiazide 25 MG tablet Commonly known as:  HYDRODIURIL TAKE 1 TABLET BY MOUTH EVERY DAY   Melatonin 5 MG Caps Take 5 mg by mouth at bedtime as needed (for sleep).   valACYclovir 1000 MG tablet Commonly known as:  VALTREX Take two pills twice a day as directed   VISINE OP Place 1 drop into both eyes daily as needed (for dry eyes).          Objective:    BP 122/81   Pulse 61   Temp 97.7 F (36.5 C) (Oral)   Ht '5\' 1"'  (1.549 m)   Wt 174 lb (78.9 kg)   BMI 32.88 kg/m   Wt Readings from Last 3 Encounters:  02/20/18 174 lb (78.9 kg)  10/23/17 176 lb (79.8 kg)  10/05/17 176 lb 1.3 oz (79.9 kg)    Physical Exam  Constitutional: She is oriented to person, place, and time. She appears well-developed and well-nourished. No distress.  Eyes: Pupils are equal, round, and reactive to light. Conjunctivae and EOM are normal.  Neck: Neck supple. No thyromegaly present.  Cardiovascular: Normal rate, regular rhythm, normal heart sounds and intact distal pulses.  No murmur heard. Pulmonary/Chest: Effort normal and breath sounds normal. No respiratory distress. She has no wheezes.  Musculoskeletal: Normal range of motion. She exhibits no edema or tenderness.  Lymphadenopathy:    She has no cervical adenopathy.  Neurological: She is alert and  oriented to person, place, and time. Coordination normal.  Skin: Skin is warm and dry. No rash noted. She is not diaphoretic.  Psychiatric: She has a normal mood and affect. Her behavior is normal.  Nursing note and vitals reviewed.       Assessment & Plan:   Problem List Items Addressed This Visit      Cardiovascular and Mediastinum   Essential hypertension - Primary   Relevant Orders   CBC with Differential/Platelet   CMP14+EGFR     Other   Obesity (BMI 30-39.9)   GAD (generalized anxiety disorder)   Relevant Orders   CBC with Differential/Platelet   Hyperlipidemia LDL goal <130   Relevant Orders   Lipid panel    Other Visit Diagnoses    Primary osteoarthritis of right knee         She is not currently having problems with the arthritis today, recommended for her to just use anti-inflammatories as needed and if it gets really bad to come in and see [place].  We will do blood work today and continue her current medications from her previous provider and we will see her back in 3 months.  Follow up plan: Return in about 3 months (around 05/23/2018), or if symptoms worsen or fail to improve, for htn and hld.  Caryl Pina, MD [facility] 02/20/2018, 10:15 AM

## 2018-02-27 ENCOUNTER — Other Ambulatory Visit: Payer: Self-pay | Admitting: Family Medicine

## 2018-02-27 ENCOUNTER — Ambulatory Visit (INDEPENDENT_AMBULATORY_CARE_PROVIDER_SITE_OTHER): Payer: Managed Care, Other (non HMO) | Admitting: Obstetrics and Gynecology

## 2018-02-27 ENCOUNTER — Encounter: Payer: Self-pay | Admitting: Obstetrics and Gynecology

## 2018-02-27 VITALS — BP 120/63 | HR 56 | Ht 61.0 in | Wt 175.0 lb

## 2018-02-27 DIAGNOSIS — Z01419 Encounter for gynecological examination (general) (routine) without abnormal findings: Secondary | ICD-10-CM

## 2018-02-27 DIAGNOSIS — I10 Essential (primary) hypertension: Secondary | ICD-10-CM

## 2018-02-27 MED ORDER — ESTRADIOL 1 MG PO TABS: 1.0000 mg | ORAL_TABLET | Freq: Every day | ORAL | 3 refills | Status: DC

## 2018-02-27 NOTE — Progress Notes (Addendum)
Patient ID: Allison [name], female   DOB: 1961/01/29, 57 y.o.   MRN: [id number]   Assessment:  Annual physical Gyn Exam s/p hysterectomy Plan:  1. pap smear NOT done 2. Continue Estradiol 1 mg tablet a day, 1 year refill 3. return 2 years or prn 3    Annual mammogram advised after age 32 Subjective:  Allison [name] is a 57 y.o. female No obstetric history on file. who presents for annual exam. No LMP recorded. Patient has had a hysterectomy. The patient has complaints today of none. Sister had Colon cancer so Chizara and her sibling gets colonoscopy every 5 years.  The following portions of the patient's history were reviewed and updated as appropriate: allergies, current medications, past family history, past medical history, past social history, past surgical history and problem list. Past Medical History:  Diagnosis Date  . Anxiety   . Depression   . Hypercholesteremia   . Hypertension     Past Surgical History:  Procedure Laterality Date  . ABDOMINAL HYSTERECTOMY     total hysterectomy with ovaries are removed.   . ADENOIDECTOMY    . ANKLE CLOSED REDUCTION    . CESAREAN SECTION     X 3  . COLONOSCOPY N/A 04/14/2013   Procedure: COLONOSCOPY;  Surgeon: Corbin Ade, MD;  [facility];  Service: Endoscopy;  Laterality: N/A;  2:15 PM-moved to [address] notified pt  . COLONOSCOPY N/A 10/23/2017   Procedure: COLONOSCOPY;  Surgeon: Corbin Ade, MD;  [facility];  Service: Endoscopy;  Laterality: N/A;  11:15am  . DILATION AND CURETTAGE OF UTERUS    . HEMORROIDECTOMY       Current Outpatient Medications:  .  ALPRAZolam (XANAX) 0.5 MG tablet, Take 1 tablet (0.5 mg total) by mouth at bedtime as needed for anxiety., Disp: 30 tablet, Rfl: 0 .  citalopram (CELEXA) 40 MG tablet, Take 1 tablet (40 mg total) by mouth daily., Disp: 90 tablet, Rfl: 1 .  enalapril (VASOTEC) 5 MG tablet, TAKE 1 TABLET BY MOUTH EVERY DAY, Disp: 90 tablet, Rfl: 0 .  estradiol  (ESTRACE) 1 MG tablet, TAKE 1 TABLET BY MOUTH EVERY DAY, Disp: 90 tablet, Rfl: 3 .  hydrochlorothiazide (HYDRODIURIL) 25 MG tablet, TAKE 1 TABLET BY MOUTH EVERY DAY, Disp: 90 tablet, Rfl: 1 .  Melatonin 5 MG CAPS, Take 5 mg by mouth at bedtime as needed (for sleep)., Disp: , Rfl:  .  Omega-3 Fatty Acids (FISH OIL PO), Take 1 capsule by mouth daily. , Disp: , Rfl:  .  Tetrahydrozoline HCl (VISINE OP), Place 1 drop into both eyes daily as needed (for dry eyes)., Disp: , Rfl:  .  valACYclovir (VALTREX) 1000 MG tablet, Take two pills twice a day as directed (Patient taking differently: Take 2,000 mg by mouth 2 (two) times daily as needed (for outbreak). ), Disp: 20 tablet, Rfl: 0  Review of Systems Constitutional: negative Gastrointestinal: negative Genitourinary: normal  Objective:  There were no vitals taken for this visit.   BMI: There is no height or weight on file to calculate BMI.  General Appearance: Alert, appropriate appearance for age. No acute distress HEENT: Grossly normal Neck / Thyroid:  Cardiovascular: RRR; normal S1, S2, no murmur Lungs: CTA bilaterally Back: No CVAT Breast Exam: No masses or nodes.No dimpling, nipple retraction or discharge, skin tissues even Gastrointestinal: Soft, non-tender, no masses or organomegaly Pelvic Exam: VAGINA: adequate support consistent with 3 c-sections, no vag deliveries  normal  appearing vagina with normal color and discharge, no lesions, estrogen effect present CERVIX: surgically absent with removal of ovaries UTERUS: surgically absent,  RECTAL: guaiac negative stool obtained,  PAP:NOT DONE DUE TO TOTAL HYSTERECTOMY BSO Lymphatic Exam: Non-palpable nodes in neck, clavicular, axillary, or inguinal regions  Skin: no rash or abnormalities Neurologic: Normal gait and speech, no tremor  Psychiatric: Alert and oriented, appropriate affect.  Urinalysis:Not done  By signing my name below, I, Arnette Norris, attest that this documentation has  been prepared under the direction and in the presence of Tilda Burrow, MD. Electronically Signed: Arnette Norris Medical Scribe. 02/27/18. 9:03 AM.  I personally performed the services described in this documentation, which was SCRIBED in my presence. The recorded information has been reviewed and considered accurate. It has been edited as necessary during review. Tilda Burrow, MD

## 2018-05-25 ENCOUNTER — Other Ambulatory Visit: Payer: Self-pay | Admitting: Family Medicine

## 2018-05-25 DIAGNOSIS — F419 Anxiety disorder, unspecified: Secondary | ICD-10-CM

## 2018-05-30 ENCOUNTER — Other Ambulatory Visit: Payer: Self-pay | Admitting: Family Medicine

## 2018-05-30 DIAGNOSIS — F419 Anxiety disorder, unspecified: Secondary | ICD-10-CM

## 2018-05-31 ENCOUNTER — Other Ambulatory Visit: Payer: Self-pay | Admitting: Family Medicine

## 2018-05-31 DIAGNOSIS — I10 Essential (primary) hypertension: Secondary | ICD-10-CM

## 2018-06-24 ENCOUNTER — Encounter: Payer: Self-pay | Admitting: Family Medicine

## 2018-06-25 ENCOUNTER — Telehealth: Payer: Self-pay | Admitting: Family Medicine

## 2018-06-25 NOTE — Telephone Encounter (Signed)
Apt made for med refill.

## 2018-06-28 ENCOUNTER — Ambulatory Visit: Payer: Managed Care, Other (non HMO) | Admitting: Family Medicine

## 2018-06-28 ENCOUNTER — Encounter: Payer: Self-pay | Admitting: Family Medicine

## 2018-06-28 VITALS — BP 123/72 | HR 59 | Temp 97.6°F | Ht 61.0 in | Wt 171.6 lb

## 2018-06-28 DIAGNOSIS — F419 Anxiety disorder, unspecified: Secondary | ICD-10-CM

## 2018-06-28 DIAGNOSIS — I10 Essential (primary) hypertension: Secondary | ICD-10-CM

## 2018-06-28 DIAGNOSIS — E785 Hyperlipidemia, unspecified: Secondary | ICD-10-CM | POA: Diagnosis not present

## 2018-06-28 DIAGNOSIS — F411 Generalized anxiety disorder: Secondary | ICD-10-CM | POA: Diagnosis not present

## 2018-06-28 NOTE — Progress Notes (Signed)
BP 123/72   Pulse (!) 59   Temp 97.6 F (36.4 C) (Oral)   Ht 5\' 1"  (1.549 m)   Wt 171 lb 9.6 oz (77.8 kg)   BMI 32.42 kg/m    Subjective:    Patient ID: Allison [name], female    DOB: 1960/07/25, 58 y.o.   MRN: [id number]  HPI: Allison [name] is a 58 y.o. female presenting on 06/28/2018 for Hypertension (4 month check up- Patient would like her left ear checked); Hyperlipidemia; and Anxiety   HPI Hypertension Patient is currently on enalapril and hydrochlorothiazide, and their blood pressure today is 123/72. Patient denies any lightheadedness or dizziness. Patient denies headaches, blurred vision, chest pains, shortness of breath, or weakness. Denies any side effects from medication and is content with current medication.   Hyperlipidemia Patient is coming in for recheck of his hyperlipidemia. The patient is currently taking fish oils. They deny any issues with myalgias or history of liver damage from it. They deny any focal numbness or weakness or chest pain.   Anxiety Patient is coming in to discuss anxiety.  She currently takes citalopram and Xanax for this and has been doing fine on it and she just uses it as needed maybe once a day and the citalopram has been doing good for her.  She denies any suicidal ideations or thoughts of hurting herself. Depression screen [facility] 2/9 06/28/2018 02/20/2018 10/05/2017 06/07/2017  Decreased Interest 0 0 1 0  Down, Depressed, Hopeless 0 0 0 0  PHQ - 2 Score 0 0 1 0     Relevant past medical, surgical, family and social history reviewed and updated as indicated. Interim medical history since our last visit reviewed. Allergies and medications reviewed and updated.  Review of Systems  Constitutional: Negative for chills and fever.  HENT: Negative for congestion.   Eyes: Negative for redness and visual disturbance.  Respiratory: Negative for chest tightness and shortness of breath.   Cardiovascular: Negative for chest pain and leg swelling.    Musculoskeletal: Negative for back pain and gait problem.  Skin: Negative for rash.  Neurological: Negative for light-headedness and headaches.  Psychiatric/Behavioral: Positive for dysphoric mood. Negative for agitation, behavioral problems, self-injury, sleep disturbance and suicidal ideas. The patient is nervous/anxious.   All other systems reviewed and are negative.   Per HPI unless specifically indicated above   Allergies as of 06/28/2018   No Known Allergies     Medication List       Accurate as of June 28, 2018  4:08 PM. Always use your most recent med list.        ALPRAZolam 0.5 MG tablet Commonly known as:  XANAX Take 1 tablet (0.5 mg total) by mouth at bedtime as needed for anxiety.   citalopram 40 MG tablet Commonly known as:  CELEXA TAKE 1 TABLET BY MOUTH EVERY DAY   enalapril 5 MG tablet Commonly known as:  VASOTEC TAKE 1 TABLET BY MOUTH EVERY DAY   estradiol 1 MG tablet Commonly known as:  ESTRACE Take 1 tablet (1 mg total) by mouth daily.   hydrochlorothiazide 25 MG tablet Commonly known as:  HYDRODIURIL TAKE 1 TABLET BY MOUTH EVERY DAY   Melatonin 5 MG Caps Take 5 mg by mouth at bedtime as needed (for sleep).   valACYclovir 1000 MG tablet Commonly known as:  VALTREX Take two pills twice a day as directed          Objective:    BP 123/72  Pulse (!) 59   Temp 97.6 F (36.4 C) (Oral)   Ht 5\' 1"  (1.549 m)   Wt 171 lb 9.6 oz (77.8 kg)   BMI 32.42 kg/m   Wt Readings from Last 3 Encounters:  06/28/18 171 lb 9.6 oz (77.8 kg)  02/27/18 175 lb (79.4 kg)  02/20/18 174 lb (78.9 kg)    Physical Exam Vitals signs and nursing note reviewed.  Constitutional:      General: She is not in acute distress.    Appearance: She is well-developed. She is not diaphoretic.  Eyes:     Conjunctiva/sclera: Conjunctivae normal.  Cardiovascular:     Rate and Rhythm: Normal rate and regular rhythm.     Heart sounds: Normal heart sounds. No murmur.   Pulmonary:     Effort: Pulmonary effort is normal. No respiratory distress.     Breath sounds: Normal breath sounds. No wheezing.  Musculoskeletal: Normal range of motion.        General: No tenderness.  Skin:    General: Skin is warm and dry.     Findings: No rash.  Neurological:     Mental Status: She is alert and oriented to person, place, and time.     Coordination: Coordination normal.  Psychiatric:        Mood and Affect: Mood is anxious and depressed.        Behavior: Behavior normal.        Thought Content: Thought content does not include suicidal ideation. Thought content does not include suicidal plan.         Assessment & Plan:   Problem List Items Addressed This Visit      Cardiovascular and Mediastinum   Essential hypertension - Primary   Relevant Medications   enalapril (VASOTEC) 5 MG tablet     Other   GAD (generalized anxiety disorder)   Relevant Medications   ALPRAZolam (XANAX) 0.5 MG tablet   Hyperlipidemia LDL goal <130   Relevant Medications   enalapril (VASOTEC) 5 MG tablet    Other Visit Diagnoses    Anxiety       Relevant Medications   ALPRAZolam (XANAX) 0.5 MG tablet   HTN, goal below 140/90       Relevant Medications   enalapril (VASOTEC) 5 MG tablet      Continue anxiety medication and blood pressure medication as she has had previously, we will continue to monitor for now.  Follow up plan: Return in about 3 months (around 09/26/2018), or if symptoms worsen or fail to improve, for Hypertension anxiety.  Counseling provided for all of the vaccine components No orders of the defined types were placed in this encounter.   Arville Care, MD [facility] 06/28/2018, 4:08 PM

## 2018-07-04 ENCOUNTER — Other Ambulatory Visit: Payer: Self-pay | Admitting: Family Medicine

## 2018-07-04 ENCOUNTER — Encounter: Payer: Self-pay | Admitting: Family Medicine

## 2018-07-04 DIAGNOSIS — F419 Anxiety disorder, unspecified: Secondary | ICD-10-CM

## 2018-07-04 NOTE — Telephone Encounter (Signed)
Patient aware and verbalizes understanding. 

## 2018-07-04 NOTE — Telephone Encounter (Signed)
This is the first time that it would be prescribed by [place] that she needs a visit to be seen and will need to sign a controlled substance abuse agreement, you can schedule that for her next week Arville Care, MD [facility] 07/04/2018, 12:54 PM

## 2018-07-05 ENCOUNTER — Telehealth: Payer: Self-pay | Admitting: Family Medicine

## 2018-07-05 NOTE — Telephone Encounter (Signed)
[email or web address]

## 2018-07-05 NOTE — Telephone Encounter (Signed)
Apt made

## 2018-07-05 NOTE — Telephone Encounter (Signed)
PT is wanting to talk to Shanda Bumps about her xanax and how she was just seen on 2/21 and that she was told that she would have to come in and sign controlled substance agreement in order to get a refill.

## 2018-07-06 ENCOUNTER — Telehealth: Payer: Self-pay | Admitting: Family Medicine

## 2018-07-06 MED ORDER — ALPRAZOLAM 0.5 MG PO TABS: 0.5000 mg | ORAL_TABLET | Freq: Every evening | ORAL | 2 refills | Status: DC | PRN

## 2018-07-06 MED ORDER — ENALAPRIL MALEATE 5 MG PO TABS: 5.0000 mg | ORAL_TABLET | Freq: Every day | ORAL | 3 refills | Status: DC

## 2018-07-06 NOTE — Telephone Encounter (Signed)
Please tell her that she can cancel her March 3 appointment, I had not been feeling good and had not fully look into it but we had discussed this refill during the visit so I went ahead and sent her 3 months so she can come back in 3 months for her check.  Tell her her wrists sorry for the mixup but I have been a little under the weather in my mind is been all over the place.  I did send her in a refill for the Xanax Arville Care, MD [facility] 07/06/2018, 8:03 AM

## 2018-07-08 NOTE — Telephone Encounter (Signed)
Husband aware.

## 2018-07-09 ENCOUNTER — Ambulatory Visit: Payer: Managed Care, Other (non HMO) | Admitting: Family Medicine

## 2018-07-10 NOTE — Telephone Encounter (Signed)
Note sent to nurse. 

## 2018-11-25 ENCOUNTER — Other Ambulatory Visit: Payer: Self-pay | Admitting: Family Medicine

## 2018-11-25 DIAGNOSIS — F419 Anxiety disorder, unspecified: Secondary | ICD-10-CM

## 2018-11-25 DIAGNOSIS — I10 Essential (primary) hypertension: Secondary | ICD-10-CM

## 2019-01-22 ENCOUNTER — Encounter: Payer: Self-pay | Admitting: Family Medicine

## 2019-02-27 ENCOUNTER — Encounter: Payer: Self-pay | Admitting: Obstetrics and Gynecology

## 2019-03-14 ENCOUNTER — Other Ambulatory Visit: Payer: Self-pay

## 2019-03-14 ENCOUNTER — Encounter: Payer: Self-pay | Admitting: Family Medicine

## 2019-03-14 ENCOUNTER — Ambulatory Visit: Payer: Managed Care, Other (non HMO) | Admitting: Family Medicine

## 2019-03-14 VITALS — BP 136/75 | HR 62 | Temp 98.2°F | Ht 61.0 in | Wt 174.8 lb

## 2019-03-14 DIAGNOSIS — F419 Anxiety disorder, unspecified: Secondary | ICD-10-CM | POA: Diagnosis not present

## 2019-03-14 DIAGNOSIS — F411 Generalized anxiety disorder: Secondary | ICD-10-CM

## 2019-03-14 DIAGNOSIS — I10 Essential (primary) hypertension: Secondary | ICD-10-CM

## 2019-03-14 DIAGNOSIS — Z23 Encounter for immunization: Secondary | ICD-10-CM

## 2019-03-14 DIAGNOSIS — E785 Hyperlipidemia, unspecified: Secondary | ICD-10-CM | POA: Diagnosis not present

## 2019-03-14 MED ORDER — CITALOPRAM HYDROBROMIDE 40 MG PO TABS: 40.0000 mg | ORAL_TABLET | Freq: Every day | ORAL | 3 refills | Status: DC

## 2019-03-14 MED ORDER — ALPRAZOLAM 0.5 MG PO TABS: 0.5000 mg | ORAL_TABLET | Freq: Every evening | ORAL | 2 refills | Status: DC | PRN

## 2019-03-14 MED ORDER — HYDROCHLOROTHIAZIDE 25 MG PO TABS: 25.0000 mg | ORAL_TABLET | Freq: Every day | ORAL | 1 refills | Status: DC

## 2019-03-14 NOTE — Progress Notes (Signed)
BP 136/75   Pulse 62   Temp 98.2 F (36.8 C) (Temporal)   Ht '5\' 1"'  (1.549 m)   Wt 174 lb 12.8 oz (79.3 kg)   SpO2 95%   BMI 33.03 kg/m    Subjective:   Patient ID: Allison [name], female    DOB: October 31, 1960, 58 y.o.   MRN: [id number]  HPI: Allison [name] is a 58 y.o. female presenting on 03/14/2019 for Hypertension (check up)   HPI Anxiety recheck And is coming in today for anxiety recheck.  She currently takes citalopram and as needed alprazolam, the last refill she had was 3 months ago for 30 tablets.  She does not take it every day Current rx-alprazolam 0.5 mg nightly as needed # meds rx-30 Effectiveness of current meds-works well, rarely has to use in the Celexa and seems to be doing the best for her Adverse reactions form meds-none  Pill count performed-No Last drug screen -N/AA ( high risk q63m moderate risk q676mlow risk yearly ) Urine drug screen today- Yes [place]  If yes were their any concerning findings? -None  No flowsheet data found.   Controlled substance contract signed on: 03/14/2019  Hypertension Patient is currently on hydrochlorothiazide and enalapril, and their blood pressure today is 136/75. Patient denies any lightheadedness or dizziness. Patient denies headaches, blurred vision, chest pains, shortness of breath, or weakness. Denies any side effects from medication and is content with current medication.   Hyperlipidemia Patient is coming in for recheck of his hyperlipidemia. The patient is currently taking no medication currently will monitor. They deny any issues with myalgias or history of liver damage from it. They deny any focal numbness or weakness or chest pain.   Relevant past medical, surgical, family and social history reviewed and updated as indicated. Interim medical history since our last visit reviewed. Allergies and medications reviewed and updated.  Review of Systems  Constitutional: Negative for chills and fever.   Eyes: Negative for visual disturbance.  Respiratory: Negative for chest tightness and shortness of breath.   Cardiovascular: Negative for chest pain and leg swelling.  Gastrointestinal: Negative for abdominal pain.  Musculoskeletal: Negative for back pain and gait problem.  Skin: Negative for rash.  Neurological: Negative for light-headedness and headaches.  Psychiatric/Behavioral: Negative for agitation and behavioral problems.  All other systems reviewed and are negative.   Per HPI unless specifically indicated above   Allergies as of 03/14/2019   No Known Allergies     Medication List       Accurate as of March 14, 2019 10:34 AM. If you have any questions, ask your nurse or doctor.        ALPRAZolam 0.5 MG tablet Commonly known as: Xanax Take 1 tablet (0.5 mg total) by mouth at bedtime as needed for anxiety.   citalopram 40 MG tablet Commonly known as: CELEXA Take 1 tablet (40 mg total) by mouth daily.   enalapril 5 MG tablet Commonly known as: VASOTEC Take 1 tablet (5 mg total) by mouth daily.   estradiol 1 MG tablet Commonly known as: ESTRACE Take 1 tablet (1 mg total) by mouth daily.   hydrochlorothiazide 25 MG tablet Commonly known as: HYDRODIURIL Take 1 tablet (25 mg total) by mouth daily.   Melatonin 5 MG Caps Take 5 mg by mouth at bedtime as needed (for sleep).   valACYclovir 1000 MG tablet Commonly known as: VALTREX Take two pills twice a day as directed What changed:  how much to take  how to take this  when to take this  reasons to take this  additional instructions        Objective:   BP 136/75   Pulse 62   Temp 98.2 F (36.8 C) (Temporal)   Ht '5\' 1"'  (1.549 m)   Wt 174 lb 12.8 oz (79.3 kg)   SpO2 95%   BMI 33.03 kg/m   Wt Readings from Last 3 Encounters:  03/14/19 174 lb 12.8 oz (79.3 kg)  06/28/18 171 lb 9.6 oz (77.8 kg)  02/27/18 175 lb (79.4 kg)    Physical Exam Vitals signs and nursing note reviewed.   Constitutional:      General: She is not in acute distress.    Appearance: She is well-developed. She is not diaphoretic.  Eyes:     Conjunctiva/sclera: Conjunctivae normal.  Cardiovascular:     Rate and Rhythm: Normal rate and regular rhythm.     Heart sounds: Normal heart sounds. No murmur.  Pulmonary:     Effort: Pulmonary effort is normal. No respiratory distress.     Breath sounds: Normal breath sounds. No wheezing.  Musculoskeletal: Normal range of motion.        General: No tenderness.  Skin:    General: Skin is warm and dry.     Findings: No rash.  Neurological:     Mental Status: She is alert and oriented to person, place, and time.     Coordination: Coordination normal.  Psychiatric:        Behavior: Behavior normal.       Assessment & Plan:   Problem List Items Addressed This Visit      Cardiovascular and Mediastinum   Essential hypertension - Primary   Relevant Medications   hydrochlorothiazide (HYDRODIURIL) 25 MG tablet   Other Relevant Orders   CMP14+EGFR     Other   GAD (generalized anxiety disorder)   Relevant Medications   ALPRAZolam (XANAX) 0.5 MG tablet   citalopram (CELEXA) 40 MG tablet   Other Relevant Orders   CBC with Differential/Platelet   TSH   DRUG SCREEN-TOXASSURE   Hyperlipidemia LDL goal <130   Relevant Medications   hydrochlorothiazide (HYDRODIURIL) 25 MG tablet   Other Relevant Orders   Lipid panel    Other Visit Diagnoses    Anxiety       Relevant Medications   ALPRAZolam (XANAX) 0.5 MG tablet   citalopram (CELEXA) 40 MG tablet   Other Relevant Orders   CBC with Differential/Platelet   HTN, goal below 140/90       Relevant Medications   hydrochlorothiazide (HYDRODIURIL) 25 MG tablet   Other Relevant Orders   CMP14+EGFR      Continue current blood pressure medication alprazolam and Celexa, sent refills, see back in 6 months Follow up plan: Return in about 6 months (around 09/11/2019), or if symptoms worsen or fail to  improve, for Hypertension and cholesterol.  Counseling provided for all of the vaccine components Orders Placed This Encounter  Procedures  . CBC with Differential/Platelet  . CMP14+EGFR  . Lipid panel  . TSH  . DRUG SCREEN-TOXASSURE    Caryl Pina, MD [facility] 03/14/2019, 10:34 AM

## 2019-03-16 LAB — TOXASSURE SELECT 13 (MW), URINE

## 2019-06-21 ENCOUNTER — Other Ambulatory Visit: Payer: Self-pay | Admitting: Obstetrics and Gynecology

## 2019-07-12 ENCOUNTER — Ambulatory Visit: Payer: Self-pay | Attending: Internal Medicine

## 2019-07-12 DIAGNOSIS — Z23 Encounter for immunization: Secondary | ICD-10-CM | POA: Insufficient documentation

## 2019-07-12 NOTE — Progress Notes (Signed)
   Covid-19 Vaccination Clinic  Name:  Allison [name]    MRN: [id number] DOB: 02-02-1961  07/12/2019  Allison [name] was observed post Covid-19 immunization for 15 minutes without incident. She was provided with Vaccine Information Sheet and instruction to access the V-Safe system.   Allison [name] was instructed to call 911 with any severe reactions post vaccine: Marland Kitchen Difficulty breathing  . Swelling of face and throat  . A fast heartbeat  . A bad rash all over body  . Dizziness and weakness   Immunizations Administered    Name Date Dose VIS Date Route   Moderna COVID-19 Vaccine 07/12/2019  8:29 AM 0.5 mL 04/08/2019 Intramuscular   Manufacturer: Moderna   Lot: [id number]   NDC: 79396-886-48

## 2019-07-15 ENCOUNTER — Ambulatory Visit: Payer: Self-pay | Admitting: *Deleted

## 2019-07-15 NOTE — Telephone Encounter (Signed)
Changed appointment per request of Patient.  Related Info to Patients husband.  Voiced understanding.  Will relate to his wife. 4/13/202 8am

## 2019-08-13 ENCOUNTER — Ambulatory Visit: Payer: Self-pay | Attending: Internal Medicine

## 2019-08-13 ENCOUNTER — Ambulatory Visit: Payer: Self-pay

## 2019-08-13 DIAGNOSIS — Z23 Encounter for immunization: Secondary | ICD-10-CM

## 2019-08-13 NOTE — Progress Notes (Signed)
   Covid-19 Vaccination Clinic  Name:  CAITLINN KLINKER    MRN: [id number] DOB: [birth year]  08/13/2019  Ms. Kellen was observed post Covid-19 immunization for 15 minutes without incident. She was provided with Vaccine Information Sheet and instruction to access the V-Safe system.   Ms. Newsome was instructed to call 911 with any severe reactions post vaccine: Marland Kitchen Difficulty breathing  . Swelling of face and throat  . A fast heartbeat  . A bad rash all over body  . Dizziness and weakness   Immunizations Administered    Name Date Dose VIS Date Route   Moderna COVID-19 Vaccine 08/13/2019  2:44 PM 0.5 mL 04/08/2019 Intramuscular   Manufacturer: Gala Murdoch   Lot: [id number]   NDC: 61224-001-80

## 2019-08-14 ENCOUNTER — Ambulatory Visit: Payer: Self-pay

## 2019-08-19 ENCOUNTER — Ambulatory Visit: Payer: Self-pay

## 2019-08-28 ENCOUNTER — Other Ambulatory Visit: Payer: Self-pay | Admitting: Family Medicine

## 2019-08-28 DIAGNOSIS — I10 Essential (primary) hypertension: Secondary | ICD-10-CM

## 2019-09-11 ENCOUNTER — Ambulatory Visit (INDEPENDENT_AMBULATORY_CARE_PROVIDER_SITE_OTHER): Payer: 59 | Admitting: Family Medicine

## 2019-09-11 ENCOUNTER — Ambulatory Visit: Payer: Managed Care, Other (non HMO) | Admitting: Family Medicine

## 2019-09-11 ENCOUNTER — Encounter: Payer: Self-pay | Admitting: Family Medicine

## 2019-09-11 ENCOUNTER — Other Ambulatory Visit: Payer: Self-pay

## 2019-09-11 VITALS — BP 128/80 | HR 57 | Temp 97.6°F | Ht 61.0 in | Wt 162.4 lb

## 2019-09-11 DIAGNOSIS — F419 Anxiety disorder, unspecified: Secondary | ICD-10-CM | POA: Diagnosis not present

## 2019-09-11 DIAGNOSIS — I10 Essential (primary) hypertension: Secondary | ICD-10-CM

## 2019-09-11 DIAGNOSIS — F411 Generalized anxiety disorder: Secondary | ICD-10-CM

## 2019-09-11 DIAGNOSIS — E785 Hyperlipidemia, unspecified: Secondary | ICD-10-CM | POA: Diagnosis not present

## 2019-09-11 MED ORDER — ALPRAZOLAM 0.5 MG PO TABS: 0.5000 mg | ORAL_TABLET | Freq: Every evening | ORAL | 2 refills | Status: DC | PRN

## 2019-09-11 NOTE — Progress Notes (Signed)
BP 128/80   Pulse (!) 57   Temp 97.6 F (36.4 C) (Temporal)   Ht '5\' 1"'  (1.549 m)   Wt 162 lb 6 oz (73.7 kg)   BMI 30.68 kg/m    Subjective:   Patient ID: Allison [name], female    DOB: [birth year], 59 y.o.   MRN: [id number]  HPI: Allison [name] is a 59 y.o. female presenting on 09/11/2019 for Medical Management of Chronic Issues   HPI Anxiety recheck Current rx-alprazolam 0.5 mg as needed # meds rx-30, she does not fill but every few months. Effectiveness of current meds-working well, taking citalopram with that and seems to be helping. Adverse reactions form meds-none she does not take it on an even weekly basis  Pill count performed-No Last drug screen -03/26/2019 ( high risk q52m moderate risk q693mlow risk yearly ) Urine drug screen today- No [place]  If yes were their any concerning findings? -None  No flowsheet data found.   Controlled substance contract signed on: 03/26/2019  Hypertension Patient is currently on enalapril, and their blood pressure today is 128/80. Patient denies any lightheadedness or dizziness. Patient denies headaches, blurred vision, chest pains, shortness of breath, or weakness. Denies any side effects from medication and is content with current medication.     Relevant past medical, surgical, family and social history reviewed and updated as indicated. Interim medical history since our last visit reviewed. Allergies and medications reviewed and updated.  Review of Systems  Constitutional: Negative for chills and fever.  Eyes: Negative for redness and visual disturbance.  Respiratory: Negative for chest tightness and shortness of breath.   Cardiovascular: Negative for chest pain and leg swelling.  Musculoskeletal: Negative for back pain and gait problem.  Skin: Negative for rash.  Neurological: Negative for light-headedness and headaches.  Psychiatric/Behavioral: Negative for agitation and behavioral problems.  All other  systems reviewed and are negative.   Per HPI unless specifically indicated above   Allergies as of 09/11/2019   No Known Allergies     Medication List       Accurate as of Sep 11, 2019  9:38 AM. If you have any questions, ask your nurse or doctor.        STOP taking these medications   Melatonin 5 MG Caps Stopped by: JoFransisca Kaufmannettinger, MD     TAKE these medications   ALPRAZolam 0.5 MG tablet Commonly known as: Xanax Take 1 tablet (0.5 mg total) by mouth at bedtime as needed for anxiety.   citalopram 40 MG tablet Commonly known as: CELEXA Take 1 tablet (40 mg total) by mouth daily.   enalapril 5 MG tablet Commonly known as: VASOTEC TAKE 1 TABLET BY MOUTH EVERY DAY   estradiol 1 MG tablet Commonly known as: ESTRACE TAKE 1 TABLET BY MOUTH EVERY DAY   hydrochlorothiazide 25 MG tablet Commonly known as: HYDRODIURIL Take 1 tablet (25 mg total) by mouth daily.   valACYclovir 1000 MG tablet Commonly known as: VALTREX Take two pills twice a day as directed What changed:   how much to take  how to take this  when to take this  reasons to take this  additional instructions        Objective:   BP 128/80   Pulse (!) 57   Temp 97.6 F (36.4 C) (Temporal)   Ht '5\' 1"'  (1.549 m)   Wt 162 lb 6 oz (73.7 kg)   BMI 30.68 kg/m   Wt  Readings from Last 3 Encounters:  09/11/19 162 lb 6 oz (73.7 kg)  03/14/19 174 lb 12.8 oz (79.3 kg)  06/28/18 171 lb 9.6 oz (77.8 kg)    Physical Exam Vitals and nursing note reviewed.  Constitutional:      General: She is not in acute distress.    Appearance: She is well-developed. She is not diaphoretic.  Eyes:     Conjunctiva/sclera: Conjunctivae normal.  Cardiovascular:     Rate and Rhythm: Normal rate and regular rhythm.     Heart sounds: Normal heart sounds. No murmur.  Pulmonary:     Effort: Pulmonary effort is normal. No respiratory distress.     Breath sounds: Normal breath sounds. No wheezing.  Musculoskeletal:          General: No tenderness. Normal range of motion.  Skin:    General: Skin is warm and dry.     Findings: No rash.  Neurological:     Mental Status: She is alert and oriented to person, place, and time.     Coordination: Coordination normal.  Psychiatric:        Behavior: Behavior normal.       Assessment & Plan:   Problem List Items Addressed This Visit      Cardiovascular and Mediastinum   Essential hypertension   Relevant Orders   CMP14+EGFR     Other   GAD (generalized anxiety disorder)   Relevant Medications   ALPRAZolam (XANAX) 0.5 MG tablet   Other Relevant Orders   CBC with Differential/Platelet   Hyperlipidemia LDL goal <130 - Primary   Relevant Orders   Lipid panel    Other Visit Diagnoses    Anxiety       Relevant Medications   ALPRAZolam (XANAX) 0.5 MG tablet      Continue blood pressure and cholesterol medicine.  Continue anxiety medicine, she continues to see her gynecologist for management of the hormone replacement therapy and continues to discuss about when to possibly taper. Follow up plan: Return in about 6 months (around 03/13/2020), or if symptoms worsen or fail to improve, for Recheck hypertension and anxiety.  Counseling provided for all of the vaccine components Orders Placed This Encounter  Procedures  . CBC with Differential/Platelet  . CMP14+EGFR  . Lipid panel    Caryl Pina, MD [facility] 09/11/2019, 9:38 AM

## 2019-09-12 LAB — CBC WITH DIFFERENTIAL/PLATELET
Basophils Absolute: 0.1 10*3/uL (ref 0.0–0.2)
Basos: 1 %
EOS (ABSOLUTE): 0.1 10*3/uL (ref 0.0–0.4)
Eos: 2 %
Hematocrit: 43.7 % (ref 34.0–46.6)
Hemoglobin: 14.7 g/dL (ref 11.1–15.9)
Immature Grans (Abs): 0 10*3/uL (ref 0.0–0.1)
Immature Granulocytes: 0 %
Lymphocytes Absolute: 1.4 10*3/uL (ref 0.7–3.1)
Lymphs: 26 %
MCH: 28.5 pg (ref 26.6–33.0)
MCHC: 33.6 g/dL (ref 31.5–35.7)
MCV: 85 fL (ref 79–97)
Monocytes Absolute: 0.4 10*3/uL (ref 0.1–0.9)
Monocytes: 7 %
Neutrophils Absolute: 3.4 10*3/uL (ref 1.4–7.0)
Neutrophils: 64 %
Platelets: 278 10*3/uL (ref 150–450)
RBC: 5.16 x10E6/uL (ref 3.77–5.28)
RDW: 13.2 % (ref 11.7–15.4)
WBC: 5.3 10*3/uL (ref 3.4–10.8)

## 2019-09-12 LAB — LIPID PANEL
Chol/HDL Ratio: 3.2 ratio (ref 0.0–4.4)
Cholesterol, Total: 251 mg/dL — ABNORMAL HIGH (ref 100–199)
HDL: 78 mg/dL (ref >39)
LDL Chol Calc (NIH): 165 mg/dL — ABNORMAL HIGH (ref 0–99)
Triglycerides: 50 mg/dL (ref 0–149)
VLDL Cholesterol Cal: 8 mg/dL (ref 5–40)

## 2019-09-12 LAB — CMP14+EGFR
ALT: 25 IU/L (ref 0–32)
AST: 21 IU/L (ref 0–40)
Albumin/Globulin Ratio: 1.8 (ref 1.2–2.2)
Albumin: 4.2 g/dL (ref 3.8–4.9)
Alkaline Phosphatase: 61 IU/L (ref 39–117)
BUN/Creatinine Ratio: 21 (ref 9–23)
BUN: 16 mg/dL (ref 6–24)
Bilirubin Total: 0.5 mg/dL (ref 0.0–1.2)
CO2: 23 mmol/L (ref 20–29)
Calcium: 9.6 mg/dL (ref 8.7–10.2)
Chloride: 103 mmol/L (ref 96–106)
Creatinine, Ser: 0.78 mg/dL (ref 0.57–1.00)
GFR calc Af Amer: 96 mL/min/{1.73_m2} (ref >59)
GFR calc non Af Amer: 83 mL/min/{1.73_m2} (ref >59)
Globulin, Total: 2.3 g/dL (ref 1.5–4.5)
Glucose: 79 mg/dL (ref 65–99)
Potassium: 3.7 mmol/L (ref 3.5–5.2)
Sodium: 142 mmol/L (ref 134–144)
Total Protein: 6.5 g/dL (ref 6.0–8.5)

## 2019-09-18 MED ORDER — ATORVASTATIN CALCIUM 20 MG PO TABS
20.0000 mg | ORAL_TABLET | Freq: Every evening | ORAL | 1 refills | Status: DC
Start: 2019-09-18 — End: 2020-03-09

## 2019-09-18 NOTE — Addendum Note (Signed)
Addended by: Adella Hare B on: 09/18/2019 09:12 AM   Modules accepted: Orders

## 2019-09-23 ENCOUNTER — Other Ambulatory Visit: Payer: Self-pay | Admitting: Family Medicine

## 2019-09-23 DIAGNOSIS — I10 Essential (primary) hypertension: Secondary | ICD-10-CM

## 2019-11-24 ENCOUNTER — Other Ambulatory Visit: Payer: Self-pay | Admitting: Family Medicine

## 2019-11-24 DIAGNOSIS — I10 Essential (primary) hypertension: Secondary | ICD-10-CM

## 2019-12-02 ENCOUNTER — Other Ambulatory Visit: Payer: Self-pay | Admitting: Family Medicine

## 2019-12-02 DIAGNOSIS — I10 Essential (primary) hypertension: Secondary | ICD-10-CM

## 2019-12-18 ENCOUNTER — Other Ambulatory Visit: Payer: Self-pay | Admitting: Family Medicine

## 2019-12-18 DIAGNOSIS — I10 Essential (primary) hypertension: Secondary | ICD-10-CM

## 2020-01-22 ENCOUNTER — Encounter: Payer: Self-pay | Admitting: *Deleted

## 2020-03-09 ENCOUNTER — Other Ambulatory Visit: Payer: Self-pay | Admitting: Family Medicine

## 2020-03-12 ENCOUNTER — Ambulatory Visit (INDEPENDENT_AMBULATORY_CARE_PROVIDER_SITE_OTHER): Payer: 59 | Admitting: Family Medicine

## 2020-03-12 ENCOUNTER — Other Ambulatory Visit: Payer: Self-pay | Admitting: Family Medicine

## 2020-03-12 ENCOUNTER — Ambulatory Visit (INDEPENDENT_AMBULATORY_CARE_PROVIDER_SITE_OTHER): Payer: 59

## 2020-03-12 ENCOUNTER — Encounter: Payer: Self-pay | Admitting: Family Medicine

## 2020-03-12 ENCOUNTER — Other Ambulatory Visit: Payer: Self-pay

## 2020-03-12 VITALS — BP 124/73 | HR 78 | Temp 98.0°F | Ht 61.0 in | Wt 163.0 lb

## 2020-03-12 DIAGNOSIS — Z23 Encounter for immunization: Secondary | ICD-10-CM | POA: Diagnosis not present

## 2020-03-12 DIAGNOSIS — Z780 Asymptomatic menopausal state: Secondary | ICD-10-CM

## 2020-03-12 DIAGNOSIS — B001 Herpesviral vesicular dermatitis: Secondary | ICD-10-CM

## 2020-03-12 DIAGNOSIS — F419 Anxiety disorder, unspecified: Secondary | ICD-10-CM

## 2020-03-12 DIAGNOSIS — I10 Essential (primary) hypertension: Secondary | ICD-10-CM

## 2020-03-12 DIAGNOSIS — E669 Obesity, unspecified: Secondary | ICD-10-CM

## 2020-03-12 DIAGNOSIS — E785 Hyperlipidemia, unspecified: Secondary | ICD-10-CM

## 2020-03-12 LAB — LIPID PANEL

## 2020-03-12 MED ORDER — HYDROCHLOROTHIAZIDE 25 MG PO TABS: 25.0000 mg | ORAL_TABLET | Freq: Every day | ORAL | 3 refills | Status: DC

## 2020-03-12 MED ORDER — VALACYCLOVIR HCL 1 G PO TABS: ORAL_TABLET | ORAL | 0 refills | Status: DC

## 2020-03-12 MED ORDER — ENALAPRIL MALEATE 5 MG PO TABS: 5.0000 mg | ORAL_TABLET | Freq: Every day | ORAL | 3 refills | Status: DC

## 2020-03-12 MED ORDER — ATORVASTATIN CALCIUM 20 MG PO TABS
20.0000 mg | ORAL_TABLET | Freq: Every day | ORAL | 3 refills | Status: DC
Start: 2020-03-12 — End: 2021-05-20

## 2020-03-12 MED ORDER — CITALOPRAM HYDROBROMIDE 40 MG PO TABS: 40.0000 mg | ORAL_TABLET | Freq: Every day | ORAL | 3 refills | Status: DC

## 2020-03-12 NOTE — Progress Notes (Signed)
BP 124/73   Pulse 78   Temp 98 F (36.7 C)   Ht _0  (1.549 m)   Wt 163 lb (73.9 kg)   SpO2 99%   BMI 30.80 kg/m    Subjective:   Patient ID: Allison [name], female    DOB: 12/06/1960, 59 y.o.   MRN: [id number]  HPI: Allison [name] is a 59 y.o. female presenting on 03/12/2020 for Medical Management of Chronic Issues, Hyperlipidemia, and Hypertension   HPI Hypertension Patient is currently on enalapril and hydrochlorothiazide, and their blood pressure today is 124/73. Patient denies any lightheadedness or dizziness. Patient denies headaches, blurred vision, chest pains, shortness of breath, or weakness. Denies any side effects from medication and is content with current medication.   Hyperlipidemia Patient is coming in for recheck of his hyperlipidemia. The patient is currently taking hyperlipidemia. They deny any issues with myalgias or history of liver damage from it. They deny any focal numbness or weakness or chest pain.   Anxiety recheck Patient is coming in today for anxiety recheck.  She uses the occasional alprazolam but it looks like she still has 2 refills from 4 months ago.  Patient also takes citalopram daily and feels like it is working very well for her and she is very satisfied with it.  Patient also uses the occasional Valtrex for cold sores and wants refill for it.  She does not have any currently but she typically gets them in the winter.  Patient is postmenopausal and needs a bone density scan, has never had 1.  Relevant past medical, surgical, family and social history reviewed and updated as indicated. Interim medical history since our last visit reviewed. Allergies and medications reviewed and updated.  Review of Systems  Constitutional: Negative for chills and fever.  Eyes: Negative for visual disturbance.  Respiratory: Negative for chest tightness and shortness of breath.   Cardiovascular: Negative for chest pain and leg swelling.  Musculoskeletal:  Negative for back pain and gait problem.  Skin: Negative for rash.  Neurological: Negative for light-headedness and headaches.  Psychiatric/Behavioral: Negative for agitation, behavioral problems, dysphoric mood, self-injury, sleep disturbance and suicidal ideas. The patient is not nervous/anxious.   All other systems reviewed and are negative.   Per HPI unless specifically indicated above   Allergies as of 03/12/2020   No Known Allergies     Medication List       Accurate as of March 12, 2020  9:36 AM. If you have any questions, ask your nurse or doctor.        ALPRAZolam 0.5 MG tablet Commonly known as: Xanax Take 1 tablet (0.5 mg total) by mouth at bedtime as needed for anxiety.   atorvastatin 20 MG tablet Commonly known as: LIPITOR Take 1 tablet (20 mg total) by mouth daily. What changed: See the new instructions. Changed by: Fransisca Kaufmann Lian Pounds, MD   citalopram 40 MG tablet Commonly known as: CELEXA Take 1 tablet (40 mg total) by mouth daily.   enalapril 5 MG tablet Commonly known as: VASOTEC Take 1 tablet (5 mg total) by mouth daily.   estradiol 1 MG tablet Commonly known as: ESTRACE TAKE 1 TABLET BY MOUTH EVERY DAY   hydrochlorothiazide 25 MG tablet Commonly known as: HYDRODIURIL Take 1 tablet (25 mg total) by mouth daily.   valACYclovir 1000 MG tablet Commonly known as: VALTREX Take two pills twice a day as directed What changed:   how much to take  how to take this  when to take this  reasons to take this  additional instructions        Objective:   BP 124/73   Pulse 78   Temp 98 F (36.7 C)   Ht _0  (1.549 m)   Wt 163 lb (73.9 kg)   SpO2 99%   BMI 30.80 kg/m   Wt Readings from Last 3 Encounters:  03/12/20 163 lb (73.9 kg)  09/11/19 162 lb 6 oz (73.7 kg)  03/14/19 174 lb 12.8 oz (79.3 kg)    Physical Exam Vitals and nursing note reviewed.  Constitutional:      General: She is not in acute distress.    Appearance: She is  well-developed. She is not diaphoretic.  Eyes:     Conjunctiva/sclera: Conjunctivae normal.  Cardiovascular:     Rate and Rhythm: Normal rate and regular rhythm.     Heart sounds: Normal heart sounds. No murmur heard.   Pulmonary:     Effort: Pulmonary effort is normal. No respiratory distress.     Breath sounds: Normal breath sounds. No wheezing.  Musculoskeletal:        General: No tenderness. Normal range of motion.  Skin:    General: Skin is warm and dry.     Findings: No rash.  Neurological:     Mental Status: She is alert and oriented to person, place, and time.     Coordination: Coordination normal.  Psychiatric:        Behavior: Behavior normal.       Assessment & Plan:   Problem List Items Addressed This Visit      Cardiovascular and Mediastinum   Essential hypertension   Relevant Medications   hydrochlorothiazide (HYDRODIURIL) 25 MG tablet   enalapril (VASOTEC) 5 MG tablet   atorvastatin (LIPITOR) 20 MG tablet   Other Relevant Orders   CMP14+EGFR     Other   Obesity (BMI 30-39.9)   Relevant Orders   CBC with Differential/Platelet   Hyperlipidemia LDL goal <130 - Primary   Relevant Medications   hydrochlorothiazide (HYDRODIURIL) 25 MG tablet   enalapril (VASOTEC) 5 MG tablet   atorvastatin (LIPITOR) 20 MG tablet   Other Relevant Orders   Lipid panel    Other Visit Diagnoses    Need for immunization against influenza       Relevant Orders   Flu Vaccine QUAD 36+ mos IM (Completed)   HTN, goal below 140/90       Relevant Medications   hydrochlorothiazide (HYDRODIURIL) 25 MG tablet   enalapril (VASOTEC) 5 MG tablet   atorvastatin (LIPITOR) 20 MG tablet   Anxiety       Relevant Medications   citalopram (CELEXA) 40 MG tablet   Other Relevant Orders   CBC with Differential/Platelet   Cold sore       Relevant Medications   valACYclovir (VALTREX) 1000 MG tablet   Postmenopausal       Relevant Orders   DG [facility] DEXA      Continue current  medication.  No changes.  We will do blood work and see how she does, ordered bone density scan should come back for Pap smear in the beginning a year. Follow up plan: Return in about 4 months (around 07/10/2020), or if symptoms worsen or fail to improve, for Adult well exam and physical and Pap.  Counseling provided for all of the vaccine components Orders Placed This Encounter  Procedures  . DG [facility] DEXA  . Flu Vaccine QUAD 36+ mos IM  .  CMP14+EGFR  . CBC with Differential/Platelet  . Lipid panel    Caryl Pina, MD [facility] 03/12/2020, 9:36 AM

## 2020-03-13 LAB — LIPID PANEL
Chol/HDL Ratio: 2.8 ratio (ref 0.0–4.4)
Cholesterol, Total: 212 mg/dL — ABNORMAL HIGH (ref 100–199)
HDL: 75 mg/dL (ref >39)
LDL Chol Calc (NIH): 125 mg/dL — ABNORMAL HIGH (ref 0–99)
Triglycerides: 68 mg/dL (ref 0–149)
VLDL Cholesterol Cal: 12 mg/dL (ref 5–40)

## 2020-03-13 LAB — CBC WITH DIFFERENTIAL/PLATELET
Basophils Absolute: 0.1 10*3/uL (ref 0.0–0.2)
Basos: 1 %
EOS (ABSOLUTE): 0.1 10*3/uL (ref 0.0–0.4)
Eos: 2 %
Hematocrit: 46.5 % (ref 34.0–46.6)
Hemoglobin: 15.8 g/dL (ref 11.1–15.9)
Immature Grans (Abs): 0 10*3/uL (ref 0.0–0.1)
Immature Granulocytes: 0 %
Lymphocytes Absolute: 1.3 10*3/uL (ref 0.7–3.1)
Lymphs: 26 %
MCH: 29.3 pg (ref 26.6–33.0)
MCHC: 34 g/dL (ref 31.5–35.7)
MCV: 86 fL (ref 79–97)
Monocytes Absolute: 0.4 10*3/uL (ref 0.1–0.9)
Monocytes: 8 %
Neutrophils Absolute: 3.1 10*3/uL (ref 1.4–7.0)
Neutrophils: 63 %
Platelets: 294 10*3/uL (ref 150–450)
RBC: 5.4 x10E6/uL — ABNORMAL HIGH (ref 3.77–5.28)
RDW: 13.1 % (ref 11.7–15.4)
WBC: 4.9 10*3/uL (ref 3.4–10.8)

## 2020-03-13 LAB — CMP14+EGFR
ALT: 29 IU/L (ref 0–32)
AST: 27 IU/L (ref 0–40)
Albumin/Globulin Ratio: 2 (ref 1.2–2.2)
Albumin: 4.3 g/dL (ref 3.8–4.9)
Alkaline Phosphatase: 65 IU/L (ref 44–121)
BUN/Creatinine Ratio: 20 (ref 9–23)
BUN: 14 mg/dL (ref 6–24)
Bilirubin Total: 0.6 mg/dL (ref 0.0–1.2)
CO2: 27 mmol/L (ref 20–29)
Calcium: 9.7 mg/dL (ref 8.7–10.2)
Chloride: 104 mmol/L (ref 96–106)
Creatinine, Ser: 0.69 mg/dL (ref 0.57–1.00)
GFR calc Af Amer: 110 mL/min/{1.73_m2} (ref >59)
GFR calc non Af Amer: 96 mL/min/{1.73_m2} (ref >59)
Globulin, Total: 2.2 g/dL (ref 1.5–4.5)
Glucose: 78 mg/dL (ref 65–99)
Potassium: 4.2 mmol/L (ref 3.5–5.2)
Sodium: 145 mmol/L — ABNORMAL HIGH (ref 134–144)
Total Protein: 6.5 g/dL (ref 6.0–8.5)

## 2020-03-15 ENCOUNTER — Other Ambulatory Visit: Payer: Self-pay | Admitting: Adult Health

## 2020-03-17 ENCOUNTER — Telehealth: Payer: Self-pay | Admitting: Family Medicine

## 2020-03-17 ENCOUNTER — Encounter: Payer: 59 | Admitting: Family Medicine

## 2020-03-17 ENCOUNTER — Other Ambulatory Visit: Payer: Self-pay | Admitting: Family Medicine

## 2020-03-17 DIAGNOSIS — F419 Anxiety disorder, unspecified: Secondary | ICD-10-CM

## 2020-03-17 NOTE — Telephone Encounter (Signed)
Please advise, last refill in chart was 09/11/19 #30 with 2 refills

## 2020-03-17 NOTE — Telephone Encounter (Signed)
  Prescription Request  03/17/2020  What is the name of the medication or equipment? xanax  Have you contacted your pharmacy to request a refill? (if applicable) yes   Which pharmacy would you like this sent to? [facility]  Pt was seen 11/5 and was told by Dettinger that she already had two refills but [facility] says that they did not have an order for a refill    Patient notified that their request is being sent to the clinical staff for review and that they should receive a response within 2 business days.

## 2020-03-18 MED ORDER — ALPRAZOLAM 0.5 MG PO TABS: 0.5000 mg | ORAL_TABLET | Freq: Every evening | ORAL | 2 refills | Status: DC | PRN

## 2020-03-18 NOTE — Telephone Encounter (Signed)
Patient aware and states that the other refills had expired.

## 2020-03-18 NOTE — Telephone Encounter (Signed)
I sent the refills for her but we really need chase down with [facility] what happened to the other refills because last time I gave her thirty with two refills and that was 3 months ago and she only filled one of them so we should probably call [facility] and see what is happening.

## 2020-11-19 ENCOUNTER — Other Ambulatory Visit: Payer: Self-pay

## 2020-11-19 MED ORDER — ESTRADIOL 1 MG PO TABS: 1.0000 mg | ORAL_TABLET | Freq: Every day | ORAL | 0 refills | Status: DC

## 2020-12-13 ENCOUNTER — Other Ambulatory Visit: Payer: Self-pay | Admitting: Adult Health

## 2020-12-28 ENCOUNTER — Ambulatory Visit (INDEPENDENT_AMBULATORY_CARE_PROVIDER_SITE_OTHER): Payer: 59 | Admitting: Family

## 2020-12-28 ENCOUNTER — Other Ambulatory Visit: Payer: Self-pay

## 2020-12-28 ENCOUNTER — Encounter: Payer: Self-pay | Admitting: Family

## 2020-12-28 ENCOUNTER — Other Ambulatory Visit (HOSPITAL_COMMUNITY)
Admission: RE | Admit: 2020-12-28 | Discharge: 2020-12-28 | Disposition: A | Discharge status: Home / Self Care | Payer: 59 | Source: Ambulatory Visit | Attending: Family | Admitting: Family

## 2020-12-28 VITALS — BP 143/87 | HR 56 | Temp 98.1°F | Ht 61.0 in | Wt 178.6 lb

## 2020-12-28 DIAGNOSIS — Z01419 Encounter for gynecological examination (general) (routine) without abnormal findings: Secondary | ICD-10-CM

## 2020-12-28 DIAGNOSIS — Z0000 Encounter for general adult medical examination without abnormal findings: Secondary | ICD-10-CM | POA: Insufficient documentation

## 2020-12-28 NOTE — Progress Notes (Signed)
Subjective:    Patient ID: Allison [name], female    DOB: [birth year], 60 y.o.   MRN: [id number]  Chief Complaint  Patient presents with   Gynecologic Exam   Pt presents to the office today for CPE with pap.  Gynecologic Exam The patient's pertinent negatives include no genital itching, genital odor or vaginal bleeding. This is a chronic problem. The current episode started more than 1 year ago. The problem occurs intermittently.  Hypertension This is a chronic problem. The current episode started more than 1 year ago. The problem has been waxing and waning since onset. Pertinent negatives include no peripheral edema or shortness of breath. Risk factors for coronary artery disease include obesity. The current treatment provides moderate improvement.     Review of Systems  Respiratory:  Negative for shortness of breath.   All other systems reviewed and are negative.     Objective:   Physical Exam Vitals reviewed.  Constitutional:      General: She is not in acute distress.    Appearance: She is well-developed.  HENT:     Head: Normocephalic and atraumatic.     Right Ear: Tympanic membrane normal.     Left Ear: Tympanic membrane normal.  Eyes:     Pupils: Pupils are equal, round, and reactive to light.  Neck:     Thyroid: No thyromegaly.  Cardiovascular:     Rate and Rhythm: Normal rate and regular rhythm.     Heart sounds: Normal heart sounds. No murmur heard. Pulmonary:     Effort: Pulmonary effort is normal. No respiratory distress.     Breath sounds: Normal breath sounds. No wheezing.  Abdominal:     General: Bowel sounds are normal. There is no distension.     Palpations: Abdomen is soft.     Tenderness: There is no abdominal tenderness.  Genitourinary:    Comments: Bimanual exam- no adnexal masses or tenderness, ovaries nonpalpable   Cervix not present- No discharge  Musculoskeletal:        General: No tenderness. Normal range of motion.     Cervical back:  Normal range of motion and neck supple.  Skin:    General: Skin is warm and dry.  Neurological:     Mental Status: She is alert and oriented to person, place, and time.     Cranial Nerves: No cranial nerve deficit.     Deep Tendon Reflexes: Reflexes are normal and symmetric.  Psychiatric:        Behavior: Behavior normal.        Thought Content: Thought content normal.        Judgment: Judgment normal.         BP (!) 143/87   Pulse (!) 56   Temp 98.1 F (36.7 C) (Temporal)   Ht '5\' 1"'  (1.549 m)   Wt 178 lb 9.6 oz (81 kg)   SpO2 93%   BMI 33.75 kg/m   Assessment & Plan:  Allison [name] comes in today with chief complaint of Gynecologic Exam   Diagnosis and orders addressed:  1. Annual physical exam - Cytology - PAP([place]) - CMP14+EGFR - CBC with Differential/Platelet - Lipid panel - TSH  2. Encounter for gynecological examination without abnormal finding - Cytology - PAP([place]) -Given patient has had hysterotomy she no longer needs pap.   Labs pending Health Maintenance reviewed Diet and exercise encouraged  Follow up plan: Keep follow up with PCP  Evelina Dun, FNP

## 2020-12-28 NOTE — Patient Instructions (Signed)
Health Maintenance, Female Adopting a healthy lifestyle and getting preventive care are important in promoting health and wellness. Ask your health care provider about: The right schedule for you to have regular tests and exams. Things you can do on your own to prevent diseases and keep yourself healthy. What should I know about diet, weight, and exercise? Eat a healthy diet  Eat a diet that includes plenty of vegetables, fruits, low-fat dairy products, and lean protein. Do not eat a lot of foods that are high in solid fats, added sugars, or sodium.  Maintain a healthy weight Body mass index (BMI) is used to identify weight problems. It estimates body fat based on height and weight. Your health care provider can help determineyour BMI and help you achieve or maintain a healthy weight. Get regular exercise Get regular exercise. This is one of the most important things you can do for your health. Most adults should: Exercise for at least 150 minutes each week. The exercise should increase your heart rate and make you sweat (moderate-intensity exercise). Do strengthening exercises at least twice a week. This is in addition to the moderate-intensity exercise. Spend less time sitting. Even light physical activity can be beneficial. Watch cholesterol and blood lipids Have your blood tested for lipids and cholesterol at 60 years of age, then havethis test every 5 years. Have your cholesterol levels checked more often if: Your lipid or cholesterol levels are high. You are older than 60 years of age. You are at high risk for heart disease. What should I know about cancer screening? Depending on your health history and family history, you may need to have cancer screening at various ages. This may include screening for: Breast cancer. Cervical cancer. Colorectal cancer. Skin cancer. Lung cancer. What should I know about heart disease, diabetes, and high blood pressure? Blood pressure and heart  disease High blood pressure causes heart disease and increases the risk of stroke. This is more likely to develop in people who have high blood pressure readings, are of African descent, or are overweight. Have your blood pressure checked: Every 3-5 years if you are 18-39 years of age. Every year if you are 40 years old or older. Diabetes Have regular diabetes screenings. This checks your fasting blood sugar level. Have the screening done: Once every three years after age 40 if you are at a normal weight and have a low risk for diabetes. More often and at a younger age if you are overweight or have a high risk for diabetes. What should I know about preventing infection? Hepatitis B If you have a higher risk for hepatitis B, you should be screened for this virus. Talk with your health care provider to find out if you are at risk forhepatitis B infection. Hepatitis C Testing is recommended for: Everyone born from 1945 through 1965. Anyone with known risk factors for hepatitis C. Sexually transmitted infections (STIs) Get screened for STIs, including gonorrhea and chlamydia, if: You are sexually active and are younger than 60 years of age. You are older than 60 years of age and your health care provider tells you that you are at risk for this type of infection. Your sexual activity has changed since you were last screened, and you are at increased risk for chlamydia or gonorrhea. Ask your health care provider if you are at risk. Ask your health care provider about whether you are at high risk for HIV. Your health care provider may recommend a prescription medicine to help   prevent HIV infection. If you choose to take medicine to prevent HIV, you should first get tested for HIV. You should then be tested every 3 months for as long as you are taking the medicine. Pregnancy If you are about to stop having your period (premenopausal) and you may become pregnant, seek counseling before you get  pregnant. Take 400 to 800 micrograms (mcg) of folic acid every day if you become pregnant. Ask for birth control (contraception) if you want to prevent pregnancy. Osteoporosis and menopause Osteoporosis is a disease in which the bones lose minerals and strength with aging. This can result in bone fractures. If you are 65 years old or older, or if you are at risk for osteoporosis and fractures, ask your health care provider if you should: Be screened for bone loss. Take a calcium or vitamin D supplement to lower your risk of fractures. Be given hormone replacement therapy (HRT) to treat symptoms of menopause. Follow these instructions at home: Lifestyle Do not use any products that contain nicotine or tobacco, such as cigarettes, e-cigarettes, and chewing tobacco. If you need help quitting, ask your health care provider. Do not use street drugs. Do not share needles. Ask your health care provider for help if you need support or information about quitting drugs. Alcohol use Do not drink alcohol if: Your health care provider tells you not to drink. You are pregnant, may be pregnant, or are planning to become pregnant. If you drink alcohol: Limit how much you use to 0-1 drink a day. Limit intake if you are breastfeeding. Be aware of how much alcohol is in your drink. In the [place] one drink equals one 12 oz bottle of beer (355 mL), one 5 oz glass of wine (148 mL), or one 1 oz glass of hard liquor (44 mL). General instructions Schedule regular health, dental, and eye exams. Stay current with your vaccines. Tell your health care provider if: You often feel depressed. You have ever been abused or do not feel safe at home. Summary Adopting a healthy lifestyle and getting preventive care are important in promoting health and wellness. Follow your health care provider's instructions about healthy diet, exercising, and getting tested or screened for diseases. Follow your health care provider's  instructions on monitoring your cholesterol and blood pressure. This information is not intended to replace advice given to you by your health care provider. Make sure you discuss any questions you have with your healthcare provider. Document Revised: 04/17/2018 Document Reviewed: 04/17/2018 Elsevier Patient Education  2022 Elsevier Inc.  

## 2020-12-29 LAB — LIPID PANEL
Chol/HDL Ratio: 2.2 ratio (ref 0.0–4.4)
Cholesterol, Total: 203 mg/dL — ABNORMAL HIGH (ref 100–199)
HDL: 94 mg/dL (ref >39)
LDL Chol Calc (NIH): 95 mg/dL (ref 0–99)
Triglycerides: 82 mg/dL (ref 0–149)
VLDL Cholesterol Cal: 14 mg/dL (ref 5–40)

## 2020-12-29 LAB — CMP14+EGFR
ALT: 26 IU/L (ref 0–32)
AST: 21 IU/L (ref 0–40)
Albumin/Globulin Ratio: 1.8 (ref 1.2–2.2)
Albumin: 4.3 g/dL (ref 3.8–4.9)
Alkaline Phosphatase: 60 IU/L (ref 44–121)
BUN/Creatinine Ratio: 21 (ref 12–28)
BUN: 15 mg/dL (ref 8–27)
Bilirubin Total: 0.5 mg/dL (ref 0.0–1.2)
CO2: 23 mmol/L (ref 20–29)
Calcium: 9.7 mg/dL (ref 8.7–10.3)
Chloride: 103 mmol/L (ref 96–106)
Creatinine, Ser: 0.71 mg/dL (ref 0.57–1.00)
Globulin, Total: 2.4 g/dL (ref 1.5–4.5)
Glucose: 90 mg/dL (ref 65–99)
Potassium: 3.6 mmol/L (ref 3.5–5.2)
Sodium: 142 mmol/L (ref 134–144)
Total Protein: 6.7 g/dL (ref 6.0–8.5)
eGFR: 97 mL/min/{1.73_m2} (ref >59)

## 2020-12-29 LAB — CBC WITH DIFFERENTIAL/PLATELET
Basophils Absolute: 0.1 10*3/uL (ref 0.0–0.2)
Basos: 1 %
EOS (ABSOLUTE): 0.1 10*3/uL (ref 0.0–0.4)
Eos: 1 %
Hematocrit: 43.7 % (ref 34.0–46.6)
Hemoglobin: 15.4 g/dL (ref 11.1–15.9)
Immature Grans (Abs): 0 10*3/uL (ref 0.0–0.1)
Immature Granulocytes: 1 %
Lymphocytes Absolute: 1.3 10*3/uL (ref 0.7–3.1)
Lymphs: 23 %
MCH: 29.9 pg (ref 26.6–33.0)
MCHC: 35.2 g/dL (ref 31.5–35.7)
MCV: 85 fL (ref 79–97)
Monocytes Absolute: 0.4 10*3/uL (ref 0.1–0.9)
Monocytes: 7 %
Neutrophils Absolute: 3.8 10*3/uL (ref 1.4–7.0)
Neutrophils: 67 %
Platelets: 279 10*3/uL (ref 150–450)
RBC: 5.15 x10E6/uL (ref 3.77–5.28)
RDW: 13.2 % (ref 11.7–15.4)
WBC: 5.6 10*3/uL (ref 3.4–10.8)

## 2020-12-29 LAB — CYTOLOGY - PAP: Diagnosis: NEGATIVE

## 2020-12-29 LAB — TSH: TSH: 2.13 u[IU]/mL (ref 0.450–4.500)

## 2021-01-24 ENCOUNTER — Telehealth: Payer: Self-pay | Admitting: Family Medicine

## 2021-01-24 ENCOUNTER — Encounter: Payer: Self-pay | Admitting: Family Medicine

## 2021-01-24 ENCOUNTER — Other Ambulatory Visit: Payer: Self-pay | Admitting: Adult Health

## 2021-01-24 ENCOUNTER — Ambulatory Visit (INDEPENDENT_AMBULATORY_CARE_PROVIDER_SITE_OTHER): Payer: 59 | Admitting: Nurse Practitioner

## 2021-01-24 DIAGNOSIS — U099 Post covid-19 condition, unspecified: Secondary | ICD-10-CM | POA: Diagnosis not present

## 2021-01-24 MED ORDER — MOLNUPIRAVIR EUA 200MG CAPSULE: 4.0000 | ORAL_CAPSULE | Freq: Two times a day (BID) | ORAL | 0 refills | Status: AC

## 2021-01-24 NOTE — Telephone Encounter (Signed)
Pt informed that letter is up front and have her son come pick it up.

## 2021-01-24 NOTE — Progress Notes (Signed)
Virtual Visit  Note Due to COVID-19 pandemic this visit was conducted virtually. This visit type was conducted due to national recommendations for restrictions regarding the COVID-19 Pandemic (e.g. social distancing, sheltering in place) in an effort to limit this patient's exposure and mitigate transmission in our community. All issues noted in this document were discussed and addressed.  A physical exam was not performed with this format.  I connected with Allison [name] on 01/24/21 at 12:32 by telephone and verified that I am speaking with the correct person using two identifiers. Allison [name] is currently located at home and no one is currently with her during visit. The provider, Mary-Margaret Daphine Deutscher, FNP is located in their office at time of visit.  I discussed the limitations, risks, security and privacy concerns of performing an evaluation and management service by telephone and the availability of in person appointments. I also discussed with the patient that there may be a patient responsible charge related to this service. The patient expressed understanding and agreed to proceed.   History and Present Illness:  Patient states 2 days ago she develop runny nose and sneezing. She is now coughing up phlegm in the mornings. She did covid test this morning and was positive.      Review of Systems  Constitutional:  Negative for diaphoresis and weight loss.  HENT:  Positive for congestion. Negative for sinus pain and sore throat.   Eyes:  Negative for blurred vision, double vision and pain.  Respiratory:  Positive for cough and sputum production. Negative for shortness of breath.   Cardiovascular:  Negative for chest pain, palpitations, orthopnea and leg swelling.  Gastrointestinal:  Negative for abdominal pain.  Musculoskeletal:  Negative for myalgias.  Skin:  Negative for rash.  Neurological:  Negative for dizziness, sensory change, loss of consciousness, weakness and headaches.   Endo/Heme/Allergies:  Negative for polydipsia. Does not bruise/bleed easily.  Psychiatric/Behavioral:  Negative for memory loss. The patient does not have insomnia.   All other systems reviewed and are negative.   Observations/Objective: Alert and oriented- answers all questions appropriately No distress   Assessment and Plan: Allison [name] in today with chief complaint of No chief complaint on file.   1. Post covid-19 condition, unspecified 1. Take meds as prescribed 2. Use a cool mist humidifier especially during the winter months and when heat has been humid. 3. Use saline nose sprays frequently 4. Saline irrigations of the nose can be very helpful if done frequently.  * 4X daily for 1 week*  * Use of a nettie pot can be helpful with this. Follow directions with this* 5. Drink plenty of fluids 6. Keep thermostat turn down low 7.For any cough or congestion  Use plain Mucinex- regular strength or max strength is fine   * Children- consult with Pharmacist for dosing 8. For fever or aces or pains- take tylenol or ibuprofen appropriate for age and weight.  * for fevers greater than 101 orally you may alternate ibuprofen and tylenol every  3 hours.    - molnupiravir EUA (LAGEVRIO) 200 mg CAPS capsule; Take 4 capsules (800 mg total) by mouth 2 (two) times daily for 5 days.  Dispense: 40 capsule; Refill: 0     Follow Up Instructions: prn    I discussed the assessment and treatment plan with the patient. The patient was provided an opportunity to ask questions and all were answered. The patient agreed with the plan and demonstrated an understanding of the instructions.  The patient was advised to call back or seek an in-person evaluation if the symptoms worsen or if the condition fails to improve as anticipated.  The above assessment and management plan was discussed with the patient. The patient verbalized understanding of and has agreed to the management plan. Patient is  aware to call the clinic if symptoms persist or worsen. Patient is aware when to return to the clinic for a follow-up visit. Patient educated on when it is appropriate to go to the emergency department.   Time call ended:  1245  I provided 13 minutes of  non face-to-face time during this encounter.    Mary-Margaret Daphine Deutscher, FNP

## 2021-02-03 ENCOUNTER — Encounter: Payer: Self-pay | Admitting: Family Medicine

## 2021-02-03 ENCOUNTER — Other Ambulatory Visit: Payer: Self-pay | Admitting: Adult Health

## 2021-02-03 ENCOUNTER — Other Ambulatory Visit: Payer: Self-pay

## 2021-02-04 MED ORDER — ESTRADIOL 1 MG PO TABS: 1.0000 mg | ORAL_TABLET | Freq: Every day | ORAL | 0 refills | Status: DC

## 2021-02-08 ENCOUNTER — Other Ambulatory Visit: Payer: Self-pay | Admitting: Family Medicine

## 2021-02-08 DIAGNOSIS — B001 Herpesviral vesicular dermatitis: Secondary | ICD-10-CM

## 2021-02-09 MED ORDER — VALACYCLOVIR HCL 1 G PO TABS: ORAL_TABLET | ORAL | 0 refills | Status: DC

## 2021-03-06 ENCOUNTER — Other Ambulatory Visit: Payer: Self-pay | Admitting: Family Medicine

## 2021-03-17 ENCOUNTER — Other Ambulatory Visit: Payer: Self-pay | Admitting: Family Medicine

## 2021-03-17 DIAGNOSIS — I10 Essential (primary) hypertension: Secondary | ICD-10-CM

## 2021-03-17 LAB — HM MAMMOGRAPHY

## 2021-03-31 ENCOUNTER — Other Ambulatory Visit: Payer: Self-pay | Admitting: Family Medicine

## 2021-04-12 ENCOUNTER — Other Ambulatory Visit: Payer: Self-pay | Admitting: Family Medicine

## 2021-04-12 DIAGNOSIS — I10 Essential (primary) hypertension: Secondary | ICD-10-CM

## 2021-04-12 NOTE — Telephone Encounter (Signed)
Dettinger. NTBS 30 days given 03/17/21

## 2021-04-13 NOTE — Telephone Encounter (Signed)
Left message for pt to call back and schedule appt  to get meds refilled

## 2021-04-14 MED ORDER — ENALAPRIL MALEATE 5 MG PO TABS: 5.0000 mg | ORAL_TABLET | Freq: Every day | ORAL | 0 refills | Status: DC

## 2021-04-14 NOTE — Telephone Encounter (Signed)
PT SCHEDULED APPT IN JAN WITH DETTINGER

## 2021-04-14 NOTE — Addendum Note (Signed)
Addended by: Julious Payer D on: 04/14/2021 03:27 PM   Modules accepted: Orders

## 2021-04-18 ENCOUNTER — Other Ambulatory Visit: Payer: Self-pay | Admitting: Family Medicine

## 2021-04-18 DIAGNOSIS — I10 Essential (primary) hypertension: Secondary | ICD-10-CM

## 2021-04-18 MED ORDER — ENALAPRIL MALEATE 5 MG PO TABS: 5.0000 mg | ORAL_TABLET | Freq: Every day | ORAL | 0 refills | Status: DC

## 2021-04-20 ENCOUNTER — Other Ambulatory Visit: Payer: Self-pay | Admitting: Family Medicine

## 2021-04-20 DIAGNOSIS — I10 Essential (primary) hypertension: Secondary | ICD-10-CM

## 2021-05-13 ENCOUNTER — Other Ambulatory Visit: Payer: Self-pay | Admitting: Family Medicine

## 2021-05-13 DIAGNOSIS — I10 Essential (primary) hypertension: Secondary | ICD-10-CM

## 2021-05-20 ENCOUNTER — Ambulatory Visit (INDEPENDENT_AMBULATORY_CARE_PROVIDER_SITE_OTHER): Payer: 59 | Admitting: Family Medicine

## 2021-05-20 ENCOUNTER — Encounter: Payer: Self-pay | Admitting: Family Medicine

## 2021-05-20 VITALS — BP 123/77 | HR 61 | Temp 97.8°F | Ht 61.0 in | Wt 177.8 lb

## 2021-05-20 DIAGNOSIS — F411 Generalized anxiety disorder: Secondary | ICD-10-CM | POA: Diagnosis not present

## 2021-05-20 DIAGNOSIS — F419 Anxiety disorder, unspecified: Secondary | ICD-10-CM | POA: Diagnosis not present

## 2021-05-20 DIAGNOSIS — E785 Hyperlipidemia, unspecified: Secondary | ICD-10-CM

## 2021-05-20 DIAGNOSIS — I10 Essential (primary) hypertension: Secondary | ICD-10-CM

## 2021-05-20 MED ORDER — ALPRAZOLAM 0.5 MG PO TABS: 0.5000 mg | ORAL_TABLET | Freq: Every evening | ORAL | 2 refills | Status: DC | PRN

## 2021-05-20 MED ORDER — ATORVASTATIN CALCIUM 20 MG PO TABS
20.0000 mg | ORAL_TABLET | Freq: Every day | ORAL | 3 refills | Status: DC
Start: 2021-05-20 — End: 2022-09-04

## 2021-05-20 MED ORDER — CITALOPRAM HYDROBROMIDE 40 MG PO TABS: 40.0000 mg | ORAL_TABLET | Freq: Every day | ORAL | 3 refills | Status: DC

## 2021-05-20 MED ORDER — HYDROCHLOROTHIAZIDE 25 MG PO TABS: 25.0000 mg | ORAL_TABLET | Freq: Every day | ORAL | 3 refills | Status: DC

## 2021-05-20 MED ORDER — ENALAPRIL MALEATE 5 MG PO TABS: 5.0000 mg | ORAL_TABLET | Freq: Every day | ORAL | 3 refills | Status: DC

## 2021-05-20 NOTE — Progress Notes (Signed)
BP 123/77    Pulse 61    Temp 97.8 F (36.6 C) (Temporal)    Ht _0  (1.549 m)    Wt 177 lb 12.8 oz (80.6 kg)    SpO2 95%    BMI 33.60 kg/m    Subjective:   Patient ID: Allison [name], female    DOB: Jul 27, 1960, 61 y.o.   MRN: [id number]  HPI: Allison [name] is a 61 y.o. female presenting on 05/20/2021 for Hypertension, Hyperlipidemia, and Medical Management of Chronic Issues   HPI Hypertension Patient is currently on enalapril and hydrochlorothiazide, and their blood pressure today is 123/77. Patient denies any lightheadedness or dizziness. Patient denies headaches, blurred vision, chest pains, shortness of breath, or weakness. Denies any side effects from medication and is content with current medication.   Hyperlipidemia Patient is coming in for recheck of his hyperlipidemia. The patient is currently taking atorvastatin. They deny any issues with myalgias or history of liver damage from it. They deny any focal numbness or weakness or chest pain.   Anxiety Current rx-citalopram and alprazolam 0.5 mg nightly as needed. # meds rx-30 Effectiveness of current meds-works well, she rarely uses, she has had the current prescription since 2021 Adverse reactions form meds-none  Pill count performed-No Last drug screen -03/26/2019 ( high risk q59m moderate risk q622mlow risk yearly ) Urine drug screen today- Yes [place]  If yes were their any concerning findings? -None  No flowsheet data found.   Controlled substance contract signed on: Today  Relevant past medical, surgical, family and social history reviewed and updated as indicated. Interim medical history since our last visit reviewed. Allergies and medications reviewed and updated.  Review of Systems  Constitutional:  Negative for chills and fever.  HENT:  Negative for congestion, ear discharge and ear pain.   Eyes:  Negative for redness and visual disturbance.  Respiratory:  Negative for chest tightness  and shortness of breath.   Cardiovascular:  Negative for chest pain and leg swelling.  Genitourinary:  Negative for difficulty urinating and dysuria.  Musculoskeletal:  Negative for back pain and gait problem.  Skin:  Negative for rash.  Neurological:  Negative for light-headedness and headaches.  Psychiatric/Behavioral:  Positive for sleep disturbance. Negative for agitation, behavioral problems, self-injury and suicidal ideas. The patient is nervous/anxious.   All other systems reviewed and are negative.  Per HPI unless specifically indicated above   Allergies as of 05/20/2021   No Known Allergies      Medication List        Accurate as of May 20, 2021  8:24 AM. If you have any questions, ask your nurse or doctor.          ALPRAZolam 0.5 MG tablet Commonly known as: Xanax Take 1 tablet (0.5 mg total) by mouth at bedtime as needed for anxiety.   atorvastatin 20 MG tablet Commonly known as: LIPITOR Take 1 tablet (20 mg total) by mouth daily.   citalopram 40 MG tablet Commonly known as: CELEXA Take 1 tablet (40 mg total) by mouth daily.   enalapril 5 MG tablet Commonly known as: VASOTEC Take 1 tablet (5 mg total) by mouth daily.   estradiol 1 MG tablet Commonly known as: ESTRACE TAKE 1 TABLET BY MOUTH EVERY DAY   hydrochlorothiazide 25 MG tablet Commonly known as: HYDRODIURIL Take 1 tablet (25 mg total) by mouth daily.   valACYclovir 1000 MG tablet Commonly known as: VALTREX Take two pills twice  a day as directed         Objective:   BP 123/77    Pulse 61    Temp 97.8 F (36.6 C) (Temporal)    Ht _0  (1.549 m)    Wt 177 lb 12.8 oz (80.6 kg)    SpO2 95%    BMI 33.60 kg/m   Wt Readings from Last 3 Encounters:  05/20/21 177 lb 12.8 oz (80.6 kg)  12/28/20 178 lb 9.6 oz (81 kg)  03/12/20 163 lb (73.9 kg)    Physical Exam Vitals and nursing note reviewed.  Constitutional:      General: She is not in acute distress.    Appearance: She is  well-developed. She is not diaphoretic.  Eyes:     Conjunctiva/sclera: Conjunctivae normal.  Cardiovascular:     Rate and Rhythm: Normal rate and regular rhythm.     Heart sounds: Normal heart sounds. No murmur heard. Pulmonary:     Effort: Pulmonary effort is normal. No respiratory distress.     Breath sounds: Normal breath sounds. No wheezing.  Musculoskeletal:        General: No tenderness. Normal range of motion.  Skin:    General: Skin is warm and dry.     Findings: No rash.  Neurological:     Mental Status: She is alert and oriented to person, place, and time.     Coordination: Coordination normal.  Psychiatric:        Behavior: Behavior normal.      Assessment & Plan:   Problem List Items Addressed This Visit       Cardiovascular and Mediastinum   Essential hypertension   Relevant Medications   atorvastatin (LIPITOR) 20 MG tablet   enalapril (VASOTEC) 5 MG tablet   hydrochlorothiazide (HYDRODIURIL) 25 MG tablet   Other Relevant Orders   CBC with Differential/Platelet   CMP14+EGFR     Other   GAD (generalized anxiety disorder)   Relevant Medications   ALPRAZolam (XANAX) 0.5 MG tablet   citalopram (CELEXA) 40 MG tablet   Other Relevant Orders   CBC with Differential/Platelet   CMP14+EGFR   ToxASSURE Select 13 (MW), Urine   Hyperlipidemia LDL goal <130 - Primary   Relevant Medications   atorvastatin (LIPITOR) 20 MG tablet   enalapril (VASOTEC) 5 MG tablet   hydrochlorothiazide (HYDRODIURIL) 25 MG tablet   Other Relevant Orders   Lipid panel   Other Visit Diagnoses     Anxiety       Relevant Medications   ALPRAZolam (XANAX) 0.5 MG tablet   citalopram (CELEXA) 40 MG tablet   Other Relevant Orders   CBC with Differential/Platelet   HTN, goal below 140/90       Relevant Medications   atorvastatin (LIPITOR) 20 MG tablet   enalapril (VASOTEC) 5 MG tablet   hydrochlorothiazide (HYDRODIURIL) 25 MG tablet       Continue current medicine, seems to be  doing well, no changes, will check blood work today. Follow up plan: Return in about 6 months (around 11/17/2021), or if symptoms worsen or fail to improve, for Hypertension and anxiety and cholesterol.  Counseling provided for all of the vaccine components Orders Placed This Encounter  Procedures   HM MAMMOGRAPHY   HM MAMMOGRAPHY   CBC with Differential/Platelet   CMP14+EGFR   Lipid panel   ToxASSURE Select 13 (MW), Urine    Caryl Pina, MD [facility] 05/20/2021, 8:24 AM

## 2021-05-20 NOTE — Addendum Note (Signed)
Addended by: Tamera Punt on: 05/20/2021 08:35 AM   Modules accepted: Orders

## 2021-05-23 ENCOUNTER — Ambulatory Visit: Payer: 59 | Admitting: Family Medicine

## 2021-05-31 LAB — CBC WITH DIFFERENTIAL/PLATELET
Basophils Absolute: 0.1 10*3/uL (ref 0.0–0.2)
Basos: 2 %
EOS (ABSOLUTE): 0.2 10*3/uL (ref 0.0–0.4)
Eos: 3 %
Hematocrit: 45.5 % (ref 34.0–46.6)
Hemoglobin: 15.2 g/dL (ref 11.1–15.9)
Immature Grans (Abs): 0 10*3/uL (ref 0.0–0.1)
Immature Granulocytes: 0 %
Lymphocytes Absolute: 1.3 10*3/uL (ref 0.7–3.1)
Lymphs: 25 %
MCH: 28.3 pg (ref 26.6–33.0)
MCHC: 33.4 g/dL (ref 31.5–35.7)
MCV: 85 fL (ref 79–97)
Monocytes Absolute: 0.4 10*3/uL (ref 0.1–0.9)
Monocytes: 8 %
Neutrophils Absolute: 3.3 10*3/uL (ref 1.4–7.0)
Neutrophils: 62 %
Platelets: 291 10*3/uL (ref 150–450)
RBC: 5.37 x10E6/uL — ABNORMAL HIGH (ref 3.77–5.28)
RDW: 13.2 % (ref 11.7–15.4)
WBC: 5.2 10*3/uL (ref 3.4–10.8)

## 2021-05-31 LAB — CMP14+EGFR
ALT: 21 IU/L (ref 0–32)
AST: 27 IU/L (ref 0–40)
Albumin/Globulin Ratio: 2.1 (ref 1.2–2.2)
Albumin: 4.4 g/dL (ref 3.8–4.9)
Alkaline Phosphatase: 57 IU/L (ref 44–121)
BUN/Creatinine Ratio: 22 (ref 12–28)
BUN: 16 mg/dL (ref 8–27)
Bilirubin Total: 0.5 mg/dL (ref 0.0–1.2)
CO2: 25 mmol/L (ref 20–29)
Calcium: 9.5 mg/dL (ref 8.7–10.3)
Chloride: 102 mmol/L (ref 96–106)
Creatinine, Ser: 0.74 mg/dL (ref 0.57–1.00)
Globulin, Total: 2.1 g/dL (ref 1.5–4.5)
Glucose: 94 mg/dL (ref 70–99)
Potassium: 3.6 mmol/L (ref 3.5–5.2)
Sodium: 141 mmol/L (ref 134–144)
Total Protein: 6.5 g/dL (ref 6.0–8.5)
eGFR: 93 mL/min/{1.73_m2} (ref >59)

## 2021-05-31 LAB — LIPID PANEL
Chol/HDL Ratio: 2.4 ratio (ref 0.0–4.4)
Cholesterol, Total: 197 mg/dL (ref 100–199)
HDL: 82 mg/dL (ref >39)
LDL Chol Calc (NIH): 104 mg/dL — ABNORMAL HIGH (ref 0–99)
Triglycerides: 58 mg/dL (ref 0–149)
VLDL Cholesterol Cal: 11 mg/dL (ref 5–40)

## 2021-05-31 LAB — DRUG SCREEN 10 W/CONF, SERUM
Amphetamines, IA: NEGATIVE ng/mL
Barbiturates, IA: NEGATIVE ug/mL
Benzodiazepines, IA: NEGATIVE ng/mL
Cocaine & Metabolite, IA: NEGATIVE ng/mL
Methadone, IA: NEGATIVE ng/mL
Opiates, IA: NEGATIVE ng/mL
Oxycodones, IA: NEGATIVE ng/mL
Phencyclidine, IA: NEGATIVE ng/mL
Propoxyphene, IA: NEGATIVE ng/mL
THC(Marijuana) Metabolite, IA: NEGATIVE ng/mL

## 2021-06-29 ENCOUNTER — Other Ambulatory Visit: Payer: Self-pay | Admitting: Family Medicine

## 2021-06-29 DIAGNOSIS — B001 Herpesviral vesicular dermatitis: Secondary | ICD-10-CM

## 2021-06-29 NOTE — Telephone Encounter (Signed)
Last office visit 05/22/21 Last refill 02/09/21, #20, no refills

## 2021-10-24 ENCOUNTER — Emergency Department (HOSPITAL_BASED_OUTPATIENT_CLINIC_OR_DEPARTMENT_OTHER)
Admission: EM | Admit: 2021-10-24 | Discharge: 2021-10-25 | Disposition: A | Discharge status: Home / Self Care | Payer: 59 | Attending: Emergency Medicine | Admitting: Emergency Medicine

## 2021-10-24 ENCOUNTER — Other Ambulatory Visit: Payer: Self-pay

## 2021-10-24 ENCOUNTER — Encounter (HOSPITAL_BASED_OUTPATIENT_CLINIC_OR_DEPARTMENT_OTHER): Payer: Self-pay

## 2021-10-24 DIAGNOSIS — R42 Dizziness and giddiness: Secondary | ICD-10-CM | POA: Insufficient documentation

## 2021-10-24 DIAGNOSIS — E876 Hypokalemia: Secondary | ICD-10-CM | POA: Insufficient documentation

## 2021-10-24 DIAGNOSIS — Z79899 Other long term (current) drug therapy: Secondary | ICD-10-CM | POA: Diagnosis not present

## 2021-10-24 DIAGNOSIS — R110 Nausea: Secondary | ICD-10-CM | POA: Insufficient documentation

## 2021-10-24 DIAGNOSIS — T502X5A Adverse effect of carbonic-anhydrase inhibitors, benzothiadiazides and other diuretics, initial encounter: Secondary | ICD-10-CM

## 2021-10-24 DIAGNOSIS — I10 Essential (primary) hypertension: Secondary | ICD-10-CM | POA: Insufficient documentation

## 2021-10-24 LAB — COMPREHENSIVE METABOLIC PANEL
ALT: 25 U/L (ref 0–44)
AST: 24 U/L (ref 15–41)
Albumin: 4.5 g/dL (ref 3.5–5.0)
Alkaline Phosphatase: 46 U/L (ref 38–126)
Anion gap: 14 (ref 5–15)
BUN: 15 mg/dL (ref 8–23)
CO2: 28 mmol/L (ref 22–32)
Calcium: 9.9 mg/dL (ref 8.9–10.3)
Chloride: 99 mmol/L (ref 98–111)
Creatinine, Ser: 0.69 mg/dL (ref 0.44–1.00)
GFR, Estimated: >60 mL/min (ref >60)
Glucose, Bld: 110 mg/dL — ABNORMAL HIGH (ref 70–99)
Potassium: 2.8 mmol/L — ABNORMAL LOW (ref 3.5–5.1)
Sodium: 141 mmol/L (ref 135–145)
Total Bilirubin: 0.8 mg/dL (ref 0.3–1.2)
Total Protein: 7.4 g/dL (ref 6.5–8.1)

## 2021-10-24 LAB — CBC WITH DIFFERENTIAL/PLATELET
Abs Immature Granulocytes: 0.04 10*3/uL (ref 0.00–0.07)
Basophils Absolute: 0.1 10*3/uL (ref 0.0–0.1)
Basophils Relative: 1 %
Eosinophils Absolute: 0 10*3/uL (ref 0.0–0.5)
Eosinophils Relative: 0 %
HCT: 43.3 % (ref 36.0–46.0)
Hemoglobin: 14.8 g/dL (ref 12.0–15.0)
Immature Granulocytes: 0 %
Lymphocytes Relative: 12 %
Lymphs Abs: 1.5 10*3/uL (ref 0.7–4.0)
MCH: 28.4 pg (ref 26.0–34.0)
MCHC: 34.2 g/dL (ref 30.0–36.0)
MCV: 83.1 fL (ref 80.0–100.0)
Monocytes Absolute: 0.7 10*3/uL (ref 0.1–1.0)
Monocytes Relative: 5 %
Neutro Abs: 10.6 10*3/uL — ABNORMAL HIGH (ref 1.7–7.7)
Neutrophils Relative %: 82 %
Platelets: 272 10*3/uL (ref 150–400)
RBC: 5.21 MIL/uL — ABNORMAL HIGH (ref 3.87–5.11)
RDW: 13.1 % (ref 11.5–15.5)
WBC: 12.9 10*3/uL — ABNORMAL HIGH (ref 4.0–10.5)
nRBC: 0 % (ref 0.0–0.2)

## 2021-10-24 MED ORDER — POTASSIUM CHLORIDE 10 MEQ/100ML IV SOLN
10.0000 meq | Freq: Once | INTRAVENOUS | Status: AC
  Administered 2021-10-24: 10 meq via INTRAVENOUS
  Filled 2021-10-24: qty 100

## 2021-10-24 MED ORDER — POTASSIUM CHLORIDE CRYS ER 20 MEQ PO TBCR: 20.0000 meq | EXTENDED_RELEASE_TABLET | Freq: Two times a day (BID) | ORAL | 0 refills | Status: DC

## 2021-10-24 MED ORDER — MECLIZINE HCL 25 MG PO TABS
25.0000 mg | ORAL_TABLET | Freq: Three times a day (TID) | ORAL | 0 refills | Status: DC | PRN
Start: 2021-10-24 — End: 2022-06-02

## 2021-10-24 MED ORDER — POTASSIUM CHLORIDE CRYS ER 20 MEQ PO TBCR
40.0000 meq | EXTENDED_RELEASE_TABLET | Freq: Once | ORAL | Status: AC
  Administered 2021-10-24: 40 meq via ORAL
  Filled 2021-10-24: qty 2

## 2021-10-24 NOTE — ED Provider Notes (Incomplete)
[facility] EMERGENCY DEPT Provider Note   CSN: [id number] Arrival date & time: 10/24/21  2016     History {Add pertinent medical, surgical, social history, OB history to HPI:1} Chief Complaint  Patient presents with   Hypertension    Allison [name] is a 61 y.o. female.  The history is provided by the patient.  Hypertension  She has history of hypertension, hyperlipidemia and comes in because of an episode of lightheadedness and nausea.  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  She did not notice if anything made it better or worse.  There was a slight sense of things spinning.  Symptoms lasted several hours before resolving.  She is back to her baseline now.  When she got home, she did check her blood pressure and it was elevated significantly higher than what it usually is.  Initial blood pressure was 156/80, and did get as high as 165 systolic at home.  Normal blood pressures never get any higher than 140 systolic.  She denies any headache and denies epistaxis.  She has been compliant with her medications.   Home Medications Prior to Admission medications   Medication Sig Start Date End Date Taking? Authorizing Provider  ALPRAZolam Prudy Feeler) 0.5 MG tablet Take 1 tablet (0.5 mg total) by mouth at bedtime as needed for anxiety. 05/20/21   Dettinger, Elige Radon, MD  atorvastatin (LIPITOR) 20 MG tablet Take 1 tablet (20 mg total) by mouth daily. 05/20/21   Dettinger, Elige Radon, MD  citalopram (CELEXA) 40 MG tablet Take 1 tablet (40 mg total) by mouth daily. 05/20/21   Dettinger, Elige Radon, MD  enalapril (VASOTEC) 5 MG tablet Take 1 tablet (5 mg total) by mouth daily. 05/20/21   Dettinger, Elige Radon, MD  estradiol (ESTRACE) 1 MG tablet TAKE 1 TABLET BY MOUTH EVERY DAY 04/01/21   Dettinger, Elige Radon, MD  hydrochlorothiazide (HYDRODIURIL) 25 MG tablet Take 1 tablet (25 mg total) by mouth daily. 05/20/21   Dettinger, Elige Radon, MD  valACYclovir (VALTREX) 1000 MG tablet TAKE TWO  PILLS TWICE A DAY AS DIRECTED 06/29/21   Dettinger, Elige Radon, MD      Allergies    Patient has no known allergies.    Review of Systems   Review of Systems  All other systems reviewed and are negative.   Physical Exam Updated Vital Signs BP (!) 151/73   Pulse 68   Temp 97.8 F (36.6 C)   Resp 16   Ht 5\' 1"  (1.549 m)   Wt 80.7 kg   SpO2 94%   BMI 33.63 kg/m  Physical Exam Vitals reviewed.   61 year old female, resting comfortably and in no acute distress. Vital signs are significant for elevated blood pressure. Oxygen saturation is 94%, which is normal. Head is normocephalic and atraumatic. PERRLA, EOMI. Oropharynx is clear. Neck is nontender and supple without adenopathy or JVD. Back is nontender and there is no CVA tenderness. Lungs are clear without rales, wheezes, or rhonchi. Chest is nontender. Heart has regular rate and rhythm without murmur. Abdomen is soft, flat, nontender. Extremities have no cyanosis or edema, full range of motion is present. Skin is warm and dry without rash. Neurologic: Mental status is normal, cranial nerves are intact, moves all extremities equally.  There is no nystagmus.  Dizziness is not reproduced by passive head movement.  ED Results / Procedures / Treatments   Labs (all labs ordered are listed, but only abnormal results are displayed) Labs  Reviewed  CBC WITH DIFFERENTIAL/PLATELET - Abnormal; Notable for the following components:      Result Value   WBC 12.9 (*)    RBC 5.21 (*)    Neutro Abs 10.6 (*)    All other components within normal limits  COMPREHENSIVE METABOLIC PANEL - Abnormal; Notable for the following components:   Potassium 2.8 (*)    Glucose, Bld 110 (*)    All other components within normal limits   Procedures Procedures  {Document cardiac monitor, telemetry assessment procedure when appropriate:1}  Medications Ordered in ED Medications  potassium chloride 10 mEq in 100 mL IVPB (has no administration in time  range)  potassium chloride SA (KLOR-CON M) CR tablet 40 mEq (has no administration in time range)    ED Course/ Medical Decision Making/ A&P                           Medical Decision Making Amount and/or Complexity of Data Reviewed Labs: ordered.   Dizziness and nausea concerning for probable peripheral vertigo.  Symptoms have resolved now, no indication for advanced imaging.  Differential diagnosis includes central vertigo, viral gastritis.  No ongoing neurologic symptoms to suggest central vertigo.  I have reviewed and interpreted her laboratory tests, and my interpretation is moderate hypokalemia likely diuretic induced.  Mild leukocytosis which is nonspecific.  While in the emergency department, patient has been observed and blood pressure has come down to 135/74 without any treatment.  My main concern at this point is her hypokalemia.  I have ordered intravenous and oral potassium, I have also ordered a magnesium level to be checked.  On review of past records, she has not had hypokalemia in the past, but potassium has been in the lower range of normal going back to 09/11/2019.  I suspect that she will need ongoing potassium supplementation.  She is discharged with a prescription for meclizine to use as needed, and oral potassium to take for 10 days.  I recommended follow-up with her primary care provider after that to monitor potassium and institute ongoing supplementation if needed.  {Document critical care time when appropriate:1} {Document review of labs and clinical decision tools ie heart score, Chads2Vasc2 etc:1}  {Document your independent review of radiology images, and any outside records:1} {Document your discussion with family members, caretakers, and with consultants:1} {Document social determinants of health affecting pt's care:1} {Document your decision making why or why not admission, treatments were needed:1} Final Clinical Impression(s) / ED Diagnoses Final diagnoses:   None    Rx / DC Orders ED Discharge Orders     None

## 2021-10-24 NOTE — ED Triage Notes (Signed)
1800 states BP high and "head feels funny" + nausea and dizzy

## 2021-10-24 NOTE — Discharge Instructions (Addendum)
Please check your blood pressure once a day, every day, and keep a record of it.  Please take that record with you when you see your primary care provider.

## 2021-10-25 ENCOUNTER — Ambulatory Visit (INDEPENDENT_AMBULATORY_CARE_PROVIDER_SITE_OTHER): Payer: 59 | Admitting: Nurse Practitioner

## 2021-10-25 ENCOUNTER — Encounter: Payer: Self-pay | Admitting: Nurse Practitioner

## 2021-10-25 ENCOUNTER — Other Ambulatory Visit: Payer: Self-pay | Admitting: Nurse Practitioner

## 2021-10-25 VITALS — BP 130/75 | HR 66 | Ht 61.0 in | Wt 177.0 lb

## 2021-10-25 DIAGNOSIS — E860 Dehydration: Secondary | ICD-10-CM | POA: Diagnosis not present

## 2021-10-25 DIAGNOSIS — R42 Dizziness and giddiness: Secondary | ICD-10-CM | POA: Diagnosis not present

## 2021-10-25 LAB — MAGNESIUM: Magnesium: 2 mg/dL (ref 1.7–2.4)

## 2021-10-25 NOTE — Progress Notes (Addendum)
Acute Office Visit  Subjective:     Patient ID: Allison [name], female    DOB: 28-Aug-1960, 61 y.o.   MRN: [id number]  Chief Complaint  Patient presents with   Hospitalization Follow-up    Last night about 8:30 - left about 1    Dizziness This is a new problem. The current episode started yesterday. The problem occurs rarely. The problem has been resolved. Pertinent negatives include no abdominal pain, chills, coughing, myalgias or numbness. Associated symptoms comments: Hypokalemia and dehydration . Nothing aggravates the symptoms. Treatments tried: hydration, potassium replacement. The treatment provided significant relief.    Review of Systems  Constitutional: Negative.  Negative for chills.  HENT: Negative.    Eyes: Negative.   Respiratory: Negative.  Negative for cough.   Cardiovascular: Negative.   Gastrointestinal:  Negative for abdominal pain.  Musculoskeletal:  Negative for myalgias.  Skin: Negative.   Neurological:  Positive for dizziness. Negative for numbness.  All other systems reviewed and are negative.       Objective:    BP 130/75   Pulse 66   Ht 5\' 1"  (1.549 m)   Wt 177 lb (80.3 kg)   SpO2 96%   BMI 33.44 kg/m  BP Readings from Last 3 Encounters:  10/25/21 130/75  10/25/21 132/73  05/20/21 123/77   Wt Readings from Last 3 Encounters:  10/25/21 177 lb (80.3 kg)  10/24/21 178 lb (80.7 kg)  05/20/21 177 lb 12.8 oz (80.6 kg)      Physical Exam Vitals and nursing note reviewed.  HENT:     Head: Normocephalic.     Right Ear: External ear normal.     Left Ear: External ear normal.     Nose: Nose normal.     Mouth/Throat:     Mouth: Mucous membranes are moist.     Pharynx: Oropharynx is clear.  Eyes:     Conjunctiva/sclera: Conjunctivae normal.  Cardiovascular:     Rate and Rhythm: Normal rate and regular rhythm.     Pulses: Normal pulses.     Heart sounds: Normal heart sounds.  Pulmonary:     Effort: Pulmonary effort is normal.      Breath sounds: Normal breath sounds.  Abdominal:     General: Bowel sounds are normal.  Skin:    General: Skin is warm.  Neurological:     General: No focal deficit present.     Mental Status: She is alert and oriented to person, place, and time. Mental status is at baseline.  Psychiatric:        Mood and Affect: Mood normal.        Behavior: Behavior normal.     Results for orders placed or performed during the hospital encounter of 10/24/21  CBC with Differential  Result Value Ref Range   WBC 12.9 (H) 4.0 - 10.5 K/uL   RBC 5.21 (H) 3.87 - 5.11 MIL/uL   Hemoglobin 14.8 12.0 - 15.0 g/dL   HCT 10/26/21 62.9 - 52.8 %   MCV 83.1 80.0 - 100.0 fL   MCH 28.4 26.0 - 34.0 pg   MCHC 34.2 30.0 - 36.0 g/dL   RDW 41.3 24.4 - 01.0 %   Platelets 272 150 - 400 K/uL   nRBC 0.0 0.0 - 0.2 %   Neutrophils Relative % 82 %   Neutro Abs 10.6 (H) 1.7 - 7.7 K/uL   Lymphocytes Relative 12 %   Lymphs Abs 1.5 0.7 - 4.0 K/uL   Monocytes  Relative 5 %   Monocytes Absolute 0.7 0.1 - 1.0 K/uL   Eosinophils Relative 0 %   Eosinophils Absolute 0.0 0.0 - 0.5 K/uL   Basophils Relative 1 %   Basophils Absolute 0.1 0.0 - 0.1 K/uL   Immature Granulocytes 0 %   Abs Immature Granulocytes 0.04 0.00 - 0.07 K/uL  Comprehensive metabolic panel  Result Value Ref Range   Sodium 141 135 - 145 mmol/L   Potassium 2.8 (L) 3.5 - 5.1 mmol/L   Chloride 99 98 - 111 mmol/L   CO2 28 22 - 32 mmol/L   Glucose, Bld 110 (H) 70 - 99 mg/dL   BUN 15 8 - 23 mg/dL   Creatinine, Ser 1.12 0.44 - 1.00 mg/dL   Calcium 9.9 8.9 - 16.2 mg/dL   Total Protein 7.4 6.5 - 8.1 g/dL   Albumin 4.5 3.5 - 5.0 g/dL   AST 24 15 - 41 U/L   ALT 25 0 - 44 U/L   Alkaline Phosphatase 46 38 - 126 U/L   Total Bilirubin 0.8 0.3 - 1.2 mg/dL   GFR, Estimated >44 >69 mL/min   Anion gap 14 5 - 15  Magnesium  Result Value Ref Range   Magnesium 2.0 1.7 - 2.4 mg/dL        Assessment & Plan:   Patient was recently treated in the emergency room for  dizziness and dehydration.  Patient received IV fluid with potassium suppliant for 2 weeks. She is following up today after ED visit. Patient is back to base line, she denies any new symptoms and taking medication as prescribed. Patient knows the signs and symptoms of dehydration and low potassium. Patient will also need to have potassium checked as she is on a diuretic for blood pressure control.  Printed hand out given.   Follow up with new or worsening symptoms.    Problem List Items Addressed This Visit   None Visit Diagnoses     Dizzy    -  Primary   Dehydration           No orders of the defined types were placed in this encounter.   No follow-ups on file.  Daryll Drown, NP

## 2021-10-25 NOTE — Progress Notes (Signed)
 Erroneous Encounter

## 2021-10-26 ENCOUNTER — Telehealth: Payer: Self-pay | Admitting: Family Medicine

## 2021-10-26 NOTE — Telephone Encounter (Signed)
Patient wants to speak with nurse about high BP.. 166/95

## 2021-10-26 NOTE — Telephone Encounter (Signed)
Unfortunately there are a lot of things that can raise or lower the blood pressure quickly including sinus pressure and illness and stress, so there are many things that could raise and lower the blood pressure but I want her to track it over the next 2 weeks and call me in some more numbers and let me know how she is doing with the dizziness if it comes back.

## 2021-10-26 NOTE — Telephone Encounter (Signed)
Pt informed and understood. She will call back with BP readings after 2 weeks. Sooner if needed.

## 2021-10-26 NOTE — Telephone Encounter (Signed)
Patient is concerned with blood pressure fluctuation.  Earlier this morning it was 166/95 and most recent was 137/88.  She was in ER on 10/24/21 with dizziness and elevated BP.  Was seen yesterday for ER follow up.  Any recommendations or reasons why  she's experiencing these fluctuations?

## 2021-11-17 ENCOUNTER — Ambulatory Visit: Payer: 59 | Admitting: Family Medicine

## 2021-11-18 ENCOUNTER — Ambulatory Visit (INDEPENDENT_AMBULATORY_CARE_PROVIDER_SITE_OTHER): Payer: 59 | Admitting: Family Medicine

## 2021-11-18 ENCOUNTER — Other Ambulatory Visit: Payer: Self-pay | Admitting: Family Medicine

## 2021-11-18 ENCOUNTER — Encounter: Payer: Self-pay | Admitting: Family Medicine

## 2021-11-18 ENCOUNTER — Telehealth: Payer: Self-pay | Admitting: Family Medicine

## 2021-11-18 VITALS — BP 122/78 | HR 58 | Temp 98.8°F | Ht 61.0 in | Wt 174.0 lb

## 2021-11-18 DIAGNOSIS — I10 Essential (primary) hypertension: Secondary | ICD-10-CM | POA: Diagnosis not present

## 2021-11-18 DIAGNOSIS — E785 Hyperlipidemia, unspecified: Secondary | ICD-10-CM

## 2021-11-18 DIAGNOSIS — Z23 Encounter for immunization: Secondary | ICD-10-CM

## 2021-11-18 DIAGNOSIS — E876 Hypokalemia: Secondary | ICD-10-CM | POA: Diagnosis not present

## 2021-11-18 DIAGNOSIS — F411 Generalized anxiety disorder: Secondary | ICD-10-CM | POA: Diagnosis not present

## 2021-11-18 DIAGNOSIS — B001 Herpesviral vesicular dermatitis: Secondary | ICD-10-CM

## 2021-11-18 DIAGNOSIS — F419 Anxiety disorder, unspecified: Secondary | ICD-10-CM

## 2021-11-18 MED ORDER — VALACYCLOVIR HCL 1 G PO TABS: 1000.0000 mg | ORAL_TABLET | Freq: Two times a day (BID) | ORAL | 1 refills | Status: DC

## 2021-11-18 NOTE — Progress Notes (Signed)
BP 122/78   Pulse (!) 58   Temp 98.8 F (37.1 C)   Ht '5\' 1"'  (1.549 m)   Wt 174 lb (78.9 kg)   SpO2 96%   BMI 32.88 kg/m    Subjective:   Patient ID: Allison [name], female    DOB: 1961/03/14, 61 y.o.   MRN: [id number]  HPI: Allison [name] is a 61 y.o. female presenting on 11/18/2021 for Medical Management of Chronic Issues   HPI Hypertension Patient is currently on enalapril and hydrochlorothiazide, and their blood pressure today is 122/78. Patient denies any lightheadedness or dizziness. Patient denies headaches, blurred vision, chest pains, shortness of breath, or weakness. Denies any side effects from medication and is content with current medication.   Hyperlipidemia Patient is coming in for recheck of his hyperlipidemia. The patient is currently taking atorvastatin. They deny any issues with myalgias or history of liver damage from it. They deny any focal numbness or weakness or chest pain.   Anxiety Patient is coming in for anxiety recheck.  She currently takes citalopram and then uses alprazolam very rarely as needed.  Patient gets Valtrex in case of cold sore flares and likes to keep it on hand.  Relevant past medical, surgical, family and social history reviewed and updated as indicated. Interim medical history since our last visit reviewed. Allergies and medications reviewed and updated.  Review of Systems  Constitutional:  Negative for chills and fever.  Eyes:  Negative for redness and visual disturbance.  Respiratory:  Negative for chest tightness and shortness of breath.   Cardiovascular:  Negative for chest pain and leg swelling.  Genitourinary:  Negative for difficulty urinating.  Musculoskeletal:  Negative for back pain and gait problem.  Skin:  Negative for rash.  Neurological:  Negative for light-headedness and headaches.  Psychiatric/Behavioral:  Negative for agitation and behavioral problems.   All other systems reviewed and are negative.   Per HPI  unless specifically indicated above   Allergies as of 11/18/2021   No Known Allergies      Medication List        Accurate as of November 18, 2021  8:28 AM. If you have any questions, ask your nurse or doctor.          STOP taking these medications    potassium chloride SA 20 MEQ tablet Commonly known as: KLOR-CON M Stopped by: Fransisca Kaufmann Anyra Kaufman, MD       TAKE these medications    ALPRAZolam 0.5 MG tablet Commonly known as: Xanax Take 1 tablet (0.5 mg total) by mouth at bedtime as needed for anxiety.   atorvastatin 20 MG tablet Commonly known as: LIPITOR 1 tablet Orally Once a day for 30 day(s)   atorvastatin 20 MG tablet Commonly known as: LIPITOR Take 1 tablet (20 mg total) by mouth daily.   cephALEXin 500 MG capsule Commonly known as: KEFLEX 1 capsule Orally two times a day   citalopram 40 MG tablet Commonly known as: CELEXA Take 1 tablet (40 mg total) by mouth daily.   enalapril 5 MG tablet Commonly known as: VASOTEC Take 1 tablet (5 mg total) by mouth daily.   estradiol 1 MG tablet Commonly known as: ESTRACE TAKE 1 TABLET BY MOUTH EVERY DAY   hydrochlorothiazide 25 MG tablet Commonly known as: HYDRODIURIL Take 1 tablet (25 mg total) by mouth daily.   meclizine 25 MG tablet Commonly known as: ANTIVERT Take 1 tablet (25 mg total) by mouth 3 (three) times daily  as needed for dizziness.   Super Calcium 1500 (600 Ca) MG Tabs tablet Generic drug: calcium carbonate 1 tablet with meals Orally Once a day   valACYclovir 1000 MG tablet Commonly known as: VALTREX Take 1 tablet (1,000 mg total) by mouth 2 (two) times daily. What changed: See the new instructions. Changed by: Fransisca Kaufmann Jasani Lengel, MD         Objective:   BP 122/78   Pulse (!) 58   Temp 98.8 F (37.1 C)   Ht '5\' 1"'  (1.549 m)   Wt 174 lb (78.9 kg)   SpO2 96%   BMI 32.88 kg/m   Wt Readings from Last 3 Encounters:  11/18/21 174 lb (78.9 kg)  10/25/21 177 lb (80.3 kg)  10/24/21  178 lb (80.7 kg)    Physical Exam Vitals and nursing note reviewed.  Constitutional:      General: She is not in acute distress.    Appearance: She is well-developed. She is not diaphoretic.  Eyes:     Conjunctiva/sclera: Conjunctivae normal.  Cardiovascular:     Rate and Rhythm: Normal rate and regular rhythm.     Heart sounds: Normal heart sounds. No murmur heard. Pulmonary:     Effort: Pulmonary effort is normal. No respiratory distress.     Breath sounds: Normal breath sounds. No wheezing.  Musculoskeletal:        General: No swelling or tenderness. Normal range of motion.  Skin:    General: Skin is warm and dry.     Findings: No rash.  Neurological:     Mental Status: She is alert and oriented to person, place, and time.     Coordination: Coordination normal.  Psychiatric:        Behavior: Behavior normal.       Assessment & Plan:   Problem List Items Addressed This Visit       Cardiovascular and Mediastinum   Essential hypertension - Primary   Relevant Orders   CMP14+EGFR     Other   GAD (generalized anxiety disorder)   Relevant Orders   CBC with Differential/Platelet   Hyperlipidemia LDL goal <130   Relevant Orders   Lipid panel   Other Visit Diagnoses     Hypokalemia       Relevant Orders   CMP14+EGFR   Cold sore       Relevant Medications   valACYclovir (VALTREX) 1000 MG tablet       Continue current medicine, will recheck potassium.  Blood pressure looks good today.  No changes Follow up plan: Return in about 6 months (around 05/21/2022), or if symptoms worsen or fail to improve, for htn and hld.  Counseling provided for all of the vaccine components Orders Placed This Encounter  Procedures   CBC with Differential/Platelet   CMP14+EGFR   Lipid panel    Caryl Pina, MD [facility] 11/18/2021, 8:28 AM

## 2021-11-19 LAB — CBC WITH DIFFERENTIAL/PLATELET
Basophils Absolute: 0.1 10*3/uL (ref 0.0–0.2)
Basos: 1 %
EOS (ABSOLUTE): 0.1 10*3/uL (ref 0.0–0.4)
Eos: 2 %
Hematocrit: 45 % (ref 34.0–46.6)
Hemoglobin: 15 g/dL (ref 11.1–15.9)
Immature Grans (Abs): 0 10*3/uL (ref 0.0–0.1)
Immature Granulocytes: 0 %
Lymphocytes Absolute: 1.3 10*3/uL (ref 0.7–3.1)
Lymphs: 24 %
MCH: 29.1 pg (ref 26.6–33.0)
MCHC: 33.3 g/dL (ref 31.5–35.7)
MCV: 87 fL (ref 79–97)
Monocytes Absolute: 0.4 10*3/uL (ref 0.1–0.9)
Monocytes: 8 %
Neutrophils Absolute: 3.6 10*3/uL (ref 1.4–7.0)
Neutrophils: 65 %
Platelets: 283 10*3/uL (ref 150–450)
RBC: 5.16 x10E6/uL (ref 3.77–5.28)
RDW: 13.5 % (ref 11.7–15.4)
WBC: 5.5 10*3/uL (ref 3.4–10.8)

## 2021-11-19 LAB — CMP14+EGFR
ALT: 31 IU/L (ref 0–32)
AST: 31 IU/L (ref 0–40)
Albumin/Globulin Ratio: 2.3 — ABNORMAL HIGH (ref 1.2–2.2)
Albumin: 4.4 g/dL (ref 3.9–4.9)
Alkaline Phosphatase: 68 IU/L (ref 44–121)
BUN/Creatinine Ratio: 17 (ref 12–28)
BUN: 12 mg/dL (ref 8–27)
Bilirubin Total: 0.5 mg/dL (ref 0.0–1.2)
CO2: 21 mmol/L (ref 20–29)
Calcium: 9.7 mg/dL (ref 8.7–10.3)
Chloride: 103 mmol/L (ref 96–106)
Creatinine, Ser: 0.7 mg/dL (ref 0.57–1.00)
Globulin, Total: 1.9 g/dL (ref 1.5–4.5)
Glucose: 93 mg/dL (ref 70–99)
Potassium: 3.5 mmol/L (ref 3.5–5.2)
Sodium: 142 mmol/L (ref 134–144)
Total Protein: 6.3 g/dL (ref 6.0–8.5)
eGFR: 98 mL/min/{1.73_m2} (ref >59)

## 2021-11-19 LAB — LIPID PANEL
Chol/HDL Ratio: 2.1 ratio (ref 0.0–4.4)
Cholesterol, Total: 167 mg/dL (ref 100–199)
HDL: 78 mg/dL (ref >39)
LDL Chol Calc (NIH): 77 mg/dL (ref 0–99)
Triglycerides: 64 mg/dL (ref 0–149)
VLDL Cholesterol Cal: 12 mg/dL (ref 5–40)

## 2021-11-20 ENCOUNTER — Encounter: Payer: Self-pay | Admitting: Family Medicine

## 2022-02-20 ENCOUNTER — Ambulatory Visit (INDEPENDENT_AMBULATORY_CARE_PROVIDER_SITE_OTHER): Payer: 59

## 2022-02-20 DIAGNOSIS — Z23 Encounter for immunization: Secondary | ICD-10-CM | POA: Diagnosis not present

## 2022-02-20 NOTE — Progress Notes (Signed)
Shingrix vaccine given to left deltoid.  Patient tolerated well. 

## 2022-04-24 ENCOUNTER — Encounter: Payer: Self-pay | Admitting: Family Medicine

## 2022-05-15 ENCOUNTER — Other Ambulatory Visit: Payer: Self-pay | Admitting: Family Medicine

## 2022-05-15 DIAGNOSIS — I10 Essential (primary) hypertension: Secondary | ICD-10-CM

## 2022-05-15 DIAGNOSIS — F419 Anxiety disorder, unspecified: Secondary | ICD-10-CM

## 2022-05-24 ENCOUNTER — Ambulatory Visit: Payer: 59 | Admitting: Family Medicine

## 2022-05-24 ENCOUNTER — Other Ambulatory Visit: Payer: Self-pay | Admitting: Family Medicine

## 2022-05-24 DIAGNOSIS — B001 Herpesviral vesicular dermatitis: Secondary | ICD-10-CM

## 2022-05-24 DIAGNOSIS — I10 Essential (primary) hypertension: Secondary | ICD-10-CM

## 2022-05-24 DIAGNOSIS — F419 Anxiety disorder, unspecified: Secondary | ICD-10-CM

## 2022-06-02 ENCOUNTER — Ambulatory Visit (INDEPENDENT_AMBULATORY_CARE_PROVIDER_SITE_OTHER): Payer: 59 | Admitting: Family Medicine

## 2022-06-02 ENCOUNTER — Encounter: Payer: Self-pay | Admitting: Family Medicine

## 2022-06-02 VITALS — BP 119/71 | HR 65 | Ht 61.0 in | Wt 174.0 lb

## 2022-06-02 DIAGNOSIS — F411 Generalized anxiety disorder: Secondary | ICD-10-CM | POA: Diagnosis not present

## 2022-06-02 DIAGNOSIS — Z79899 Other long term (current) drug therapy: Secondary | ICD-10-CM

## 2022-06-02 DIAGNOSIS — E785 Hyperlipidemia, unspecified: Secondary | ICD-10-CM | POA: Diagnosis not present

## 2022-06-02 DIAGNOSIS — I10 Essential (primary) hypertension: Secondary | ICD-10-CM | POA: Diagnosis not present

## 2022-06-02 DIAGNOSIS — F419 Anxiety disorder, unspecified: Secondary | ICD-10-CM | POA: Diagnosis not present

## 2022-06-02 LAB — CBC WITH DIFFERENTIAL/PLATELET
Basophils Absolute: 0.1 10*3/uL (ref 0.0–0.2)
Basos: 1 %
EOS (ABSOLUTE): 0.1 10*3/uL (ref 0.0–0.4)
Eos: 2 %
Hematocrit: 42.9 % (ref 34.0–46.6)
Hemoglobin: 14.1 g/dL (ref 11.1–15.9)
Immature Grans (Abs): 0 10*3/uL (ref 0.0–0.1)
Immature Granulocytes: 0 %
Lymphocytes Absolute: 1.4 10*3/uL (ref 0.7–3.1)
Lymphs: 23 %
MCH: 28.3 pg (ref 26.6–33.0)
MCHC: 32.9 g/dL (ref 31.5–35.7)
MCV: 86 fL (ref 79–97)
Monocytes Absolute: 0.4 10*3/uL (ref 0.1–0.9)
Monocytes: 7 %
Neutrophils Absolute: 4 10*3/uL (ref 1.4–7.0)
Neutrophils: 67 %
Platelets: 271 10*3/uL (ref 150–450)
RBC: 4.99 x10E6/uL (ref 3.77–5.28)
RDW: 12.8 % (ref 11.7–15.4)
WBC: 5.9 10*3/uL (ref 3.4–10.8)

## 2022-06-02 LAB — CMP14+EGFR
ALT: 27 IU/L (ref 0–32)
AST: 26 IU/L (ref 0–40)
Albumin/Globulin Ratio: 2.4 — ABNORMAL HIGH (ref 1.2–2.2)
Albumin: 4.5 g/dL (ref 3.9–4.9)
Alkaline Phosphatase: 67 IU/L (ref 44–121)
BUN/Creatinine Ratio: 19 (ref 12–28)
BUN: 14 mg/dL (ref 8–27)
Bilirubin Total: 0.6 mg/dL (ref 0.0–1.2)
CO2: 25 mmol/L (ref 20–29)
Calcium: 9.6 mg/dL (ref 8.7–10.3)
Chloride: 101 mmol/L (ref 96–106)
Creatinine, Ser: 0.73 mg/dL (ref 0.57–1.00)
Globulin, Total: 1.9 g/dL (ref 1.5–4.5)
Glucose: 88 mg/dL (ref 70–99)
Potassium: 3.7 mmol/L (ref 3.5–5.2)
Sodium: 141 mmol/L (ref 134–144)
Total Protein: 6.4 g/dL (ref 6.0–8.5)
eGFR: 94 mL/min/{1.73_m2} (ref >59)

## 2022-06-02 LAB — LIPID PANEL
Chol/HDL Ratio: 2.2 ratio (ref 0.0–4.4)
Cholesterol, Total: 179 mg/dL (ref 100–199)
HDL: 83 mg/dL (ref >39)
LDL Chol Calc (NIH): 82 mg/dL (ref 0–99)
Triglycerides: 78 mg/dL (ref 0–149)
VLDL Cholesterol Cal: 14 mg/dL (ref 5–40)

## 2022-06-02 MED ORDER — ALPRAZOLAM 0.5 MG PO TABS: 0.5000 mg | ORAL_TABLET | Freq: Every evening | ORAL | 0 refills | Status: DC | PRN

## 2022-06-02 NOTE — Addendum Note (Signed)
Addended by: Alphonzo Dublin on: 06/02/2022 10:19 AM   Modules accepted: Orders

## 2022-06-02 NOTE — Progress Notes (Signed)
BP 119/71   Pulse 65   Ht 5\' 1"  (1.549 m)   Wt 174 lb (78.9 kg)   SpO2 96%   BMI 32.88 kg/m    Subjective:   Patient ID: Allison [name], female    DOB: Apr 23, 1961, 62 y.o.   MRN: [id number]  HPI: KATHEREN JIMMERSON is a 62 y.o. female presenting on 06/02/2022 for Medical Management of Chronic Issues, Hyperlipidemia, Hypertension, and Anxiety   HPI Hypertension Patient is currently on enalapril and hydrochlorothiazide, and their blood pressure today is 119/71. Patient denies any lightheadedness or dizziness. Patient denies headaches, blurred vision, chest pains, shortness of breath, or weakness. Denies any side effects from medication and is content with current medication.   Hyperlipidemia Patient is coming in for recheck of his hyperlipidemia. The patient is currently taking atorvastatin. They deny any issues with myalgias or history of liver damage from it. They deny any focal numbness or weakness or chest pain.   Anxiety recheck Patient is coming in today for anxiety recheck.  She currently takes citalopram is alprazolam very infrequently as needed.  Relevant past medical, surgical, family and social history reviewed and updated as indicated. Interim medical history since our last visit reviewed. Allergies and medications reviewed and updated.  Review of Systems  Constitutional:  Negative for chills and fever.  Eyes:  Negative for visual disturbance.  Respiratory:  Negative for chest tightness and shortness of breath.   Cardiovascular:  Negative for chest pain and leg swelling.  Musculoskeletal:  Negative for back pain and gait problem.  Skin:  Negative for rash.  Neurological:  Negative for dizziness, light-headedness and headaches.  Psychiatric/Behavioral:  Negative for agitation and behavioral problems.   All other systems reviewed and are negative.   Per HPI unless specifically indicated above   Allergies as of 06/02/2022   No Known Allergies      Medication List         Accurate as of June 02, 2022 10:04 AM. If you have any questions, ask your nurse or doctor.          STOP taking these medications    cephALEXin 500 MG capsule Commonly known as: KEFLEX Stopped by: Worthy Rancher, MD   meclizine 25 MG tablet Commonly known as: ANTIVERT Stopped by: Fransisca Kaufmann Jalilah Wiltsie, MD   Super Calcium 1500 (600 Ca) MG Tabs tablet Generic drug: calcium carbonate Stopped by: Fransisca Kaufmann Samora Jernberg, MD       TAKE these medications    ALPRAZolam 0.5 MG tablet Commonly known as: XANAX Take 1 tablet (0.5 mg total) by mouth at bedtime as needed for anxiety. What changed: See the new instructions. Changed by: Fransisca Kaufmann Seville Brick, MD   atorvastatin 20 MG tablet Commonly known as: LIPITOR Take 1 tablet (20 mg total) by mouth daily.   citalopram 40 MG tablet Commonly known as: CELEXA TAKE 1 TABLET BY MOUTH EVERY DAY   enalapril 5 MG tablet Commonly known as: VASOTEC TAKE 1 TABLET (5 MG TOTAL) BY MOUTH DAILY.   estradiol 1 MG tablet Commonly known as: ESTRACE TAKE 1 TABLET BY MOUTH EVERY DAY   hydrochlorothiazide 25 MG tablet Commonly known as: HYDRODIURIL Take 1 tablet (25 mg total) by mouth daily. (NEEDS TO BE SEEN BEFORE NEXT REFILL)   valACYclovir 1000 MG tablet Commonly known as: VALTREX Take 1 tablet (1,000 mg total) by mouth 2 (two) times daily.         Objective:   BP 119/71   Pulse  65   Ht 5\' 1"  (1.549 m)   Wt 174 lb (78.9 kg)   SpO2 96%   BMI 32.88 kg/m   Wt Readings from Last 3 Encounters:  06/02/22 174 lb (78.9 kg)  11/18/21 174 lb (78.9 kg)  10/25/21 177 lb (80.3 kg)    Physical Exam Vitals and nursing note reviewed.  Constitutional:      General: She is not in acute distress.    Appearance: She is well-developed. She is not diaphoretic.  Eyes:     Conjunctiva/sclera: Conjunctivae normal.  Cardiovascular:     Rate and Rhythm: Normal rate and regular rhythm.     Heart sounds: Normal heart sounds. No murmur  heard. Pulmonary:     Effort: Pulmonary effort is normal. No respiratory distress.     Breath sounds: Normal breath sounds. No wheezing.  Musculoskeletal:        General: No swelling or tenderness. Normal range of motion.  Skin:    General: Skin is warm and dry.     Findings: No rash.  Neurological:     Mental Status: She is alert and oriented to person, place, and time.     Coordination: Coordination normal.  Psychiatric:        Behavior: Behavior normal.       Assessment & Plan:   Problem List Items Addressed This Visit       Cardiovascular and Mediastinum   Essential hypertension - Primary   Relevant Orders   CMP14+EGFR   CBC with Differential/Platelet   Lipid panel     Other   GAD (generalized anxiety disorder)   Relevant Medications   ALPRAZolam (XANAX) 0.5 MG tablet   Other Relevant Orders   CMP14+EGFR   CBC with Differential/Platelet   Lipid panel   Hyperlipidemia LDL goal <130   Relevant Orders   CMP14+EGFR   CBC with Differential/Platelet   Lipid panel   Other Visit Diagnoses     Anxiety       Relevant Medications   ALPRAZolam (XANAX) 0.5 MG tablet       Continue current medicine, no changes.  Will do blood work today.  No changes Follow up plan: Return in about 3 months (around 09/01/2022), or if symptoms worsen or fail to improve, for Hypertension and anxiety hyperlipidemia.  Counseling provided for all of the vaccine components Orders Placed This Encounter  Procedures   CMP14+EGFR   CBC with Differential/Platelet   Lipid panel    Caryl Pina, MD [facility] 06/02/2022, 10:04 AM

## 2022-06-07 LAB — TOXASSURE SELECT 13 (MW), URINE

## 2022-06-16 ENCOUNTER — Other Ambulatory Visit: Payer: Self-pay | Admitting: Family Medicine

## 2022-06-16 DIAGNOSIS — I10 Essential (primary) hypertension: Secondary | ICD-10-CM

## 2022-08-09 ENCOUNTER — Other Ambulatory Visit: Payer: Self-pay | Admitting: Family Medicine

## 2022-08-09 DIAGNOSIS — F419 Anxiety disorder, unspecified: Secondary | ICD-10-CM

## 2022-08-13 ENCOUNTER — Other Ambulatory Visit: Payer: Self-pay | Admitting: Family Medicine

## 2022-08-31 ENCOUNTER — Telehealth: Payer: 59 | Admitting: Family Medicine

## 2022-09-04 ENCOUNTER — Ambulatory Visit (INDEPENDENT_AMBULATORY_CARE_PROVIDER_SITE_OTHER): Payer: 59 | Admitting: Family Medicine

## 2022-09-04 VITALS — BP 137/76 | HR 60 | Ht 61.0 in | Wt 177.0 lb

## 2022-09-04 DIAGNOSIS — E785 Hyperlipidemia, unspecified: Secondary | ICD-10-CM

## 2022-09-04 DIAGNOSIS — F411 Generalized anxiety disorder: Secondary | ICD-10-CM | POA: Diagnosis not present

## 2022-09-04 DIAGNOSIS — Z90710 Acquired absence of both cervix and uterus: Secondary | ICD-10-CM

## 2022-09-04 DIAGNOSIS — E8940 Asymptomatic postprocedural ovarian failure: Secondary | ICD-10-CM | POA: Diagnosis not present

## 2022-09-04 DIAGNOSIS — I10 Essential (primary) hypertension: Secondary | ICD-10-CM | POA: Diagnosis not present

## 2022-09-04 MED ORDER — ESTRADIOL 1 MG PO TABS
1.0000 mg | ORAL_TABLET | Freq: Every day | ORAL | 3 refills | Status: DC
Start: 2022-09-04 — End: 2023-09-17

## 2022-09-04 MED ORDER — ENALAPRIL MALEATE 5 MG PO TABS: 5.0000 mg | ORAL_TABLET | Freq: Every day | ORAL | 3 refills | Status: DC

## 2022-09-04 MED ORDER — HYDROCHLOROTHIAZIDE 25 MG PO TABS: 25.0000 mg | ORAL_TABLET | Freq: Every day | ORAL | 3 refills | Status: DC

## 2022-09-04 MED ORDER — ATORVASTATIN CALCIUM 20 MG PO TABS: 20.0000 mg | ORAL_TABLET | Freq: Every day | ORAL | 3 refills | Status: DC

## 2022-09-04 MED ORDER — CITALOPRAM HYDROBROMIDE 40 MG PO TABS: 40.0000 mg | ORAL_TABLET | Freq: Every day | ORAL | 3 refills | Status: DC

## 2022-09-04 NOTE — Progress Notes (Signed)
BP 137/76   Pulse 60   Ht 5\' 1"  (1.549 m)   Wt 177 lb (80.3 kg)   SpO2 96%   BMI 33.44 kg/m    Subjective:   Patient ID: Allison [name], female    DOB: 1960-05-15, 62 y.o.   MRN: [id number]  HPI: Allison [name] is a 62 y.o. female presenting on 09/04/2022 for Hyperlipidemia, Medical Management of Chronic Issues, and Hypertension   HPI Hypertension Patient is currently on enalapril and hydrochlorothiazide, and their blood pressure today is 137/76. Patient denies any lightheadedness or dizziness. Patient denies headaches, blurred vision, chest pains, shortness of breath, or weakness. Denies any side effects from medication and is content with current medication.   Hyperlipidemia Patient is coming in for recheck of his hyperlipidemia. The patient is currently taking atorvastatin. They deny any issues with myalgias or history of liver damage from it. They deny any focal numbness or weakness or chest pain.   Anxiety recheck Patient is coming in today for anxiety recheck.  She currently takes citalopram and uses alprazolam very rarely or occasionally.  She feels like she is doing well.    09/04/2022    9:36 AM 09/04/2022    9:35 AM 06/02/2022    9:31 AM 11/18/2021    8:07 AM 10/25/2021    9:56 AM  Depression screen PHQ 2/9  Decreased Interest  0 0 0 0  Down, Depressed, Hopeless  0 0 0 0  PHQ - 2 Score  0 0 0 0  Altered sleeping 0  0 0 0  Tired, decreased energy 0  0 0 0  Change in appetite 0  0 0 0  Feeling bad or failure about yourself  0  0 0 0  Trouble concentrating 0  0 0 0  Moving slowly or fidgety/restless 0  0 0 0  Suicidal thoughts 0  0 0 0  PHQ-9 Score   0 0 0  Difficult doing work/chores Not difficult at all  Not difficult at all Not difficult at all     Post hysterectomy menopause symptoms Patient is on HRT for post hysterectomy menopausal symptoms including hot flashes.  We have discussed the pros and cons of HRT about eventually discontinuing at some point.  Weigh the  risk and benefits every time    Relevant past medical, surgical, family and social history reviewed and updated as indicated. Interim medical history since our last visit reviewed. Allergies and medications reviewed and updated.  Review of Systems  Constitutional:  Negative for chills and fever.  Eyes:  Negative for visual disturbance.  Respiratory:  Negative for chest tightness and shortness of breath.   Cardiovascular:  Negative for chest pain and leg swelling.  Genitourinary:  Negative for difficulty urinating and dysuria.  Musculoskeletal:  Negative for back pain and gait problem.  Skin:  Negative for rash.  Neurological:  Negative for dizziness, light-headedness and headaches.  Psychiatric/Behavioral:  Negative for agitation and behavioral problems.   All other systems reviewed and are negative.   Per HPI unless specifically indicated above   Allergies as of 09/04/2022   No Known Allergies      Medication List        Accurate as of September 04, 2022  9:51 AM. If you have any questions, ask your nurse or doctor.          ALPRAZolam 0.5 MG tablet Commonly known as: XANAX Take 1 tablet (0.5 mg total) by mouth at bedtime as needed  for anxiety.   atorvastatin 20 MG tablet Commonly known as: LIPITOR Take 1 tablet (20 mg total) by mouth daily.   citalopram 40 MG tablet Commonly known as: CELEXA Take 1 tablet (40 mg total) by mouth daily.   enalapril 5 MG tablet Commonly known as: VASOTEC Take 1 tablet (5 mg total) by mouth daily.   estradiol 1 MG tablet Commonly known as: ESTRACE Take 1 tablet (1 mg total) by mouth daily.   hydrochlorothiazide 25 MG tablet Commonly known as: HYDRODIURIL Take 1 tablet (25 mg total) by mouth daily. What changed: additional instructions Changed by: Elige Radon Savaughn Karwowski, MD   valACYclovir 1000 MG tablet Commonly known as: VALTREX Take 1 tablet (1,000 mg total) by mouth 2 (two) times daily.         Objective:   BP 137/76    Pulse 60   Ht 5\' 1"  (1.549 m)   Wt 177 lb (80.3 kg)   SpO2 96%   BMI 33.44 kg/m   Wt Readings from Last 3 Encounters:  09/04/22 177 lb (80.3 kg)  06/02/22 174 lb (78.9 kg)  11/18/21 174 lb (78.9 kg)    Physical Exam Vitals and nursing note reviewed.  Constitutional:      General: She is not in acute distress.    Appearance: She is well-developed. She is not diaphoretic.  Eyes:     Conjunctiva/sclera: Conjunctivae normal.  Cardiovascular:     Rate and Rhythm: Normal rate and regular rhythm.     Heart sounds: Normal heart sounds. No murmur heard. Pulmonary:     Effort: Pulmonary effort is normal. No respiratory distress.     Breath sounds: Normal breath sounds. No wheezing.  Musculoskeletal:        General: No swelling or tenderness. Normal range of motion.  Skin:    General: Skin is warm and dry.     Findings: No rash.  Neurological:     Mental Status: She is alert and oriented to person, place, and time.     Coordination: Coordination normal.  Psychiatric:        Behavior: Behavior normal.       Assessment & Plan:   Problem List Items Addressed This Visit       Cardiovascular and Mediastinum   Essential hypertension - Primary   Relevant Medications   hydrochlorothiazide (HYDRODIURIL) 25 MG tablet   enalapril (VASOTEC) 5 MG tablet   atorvastatin (LIPITOR) 20 MG tablet     Other   GAD (generalized anxiety disorder)   Relevant Medications   citalopram (CELEXA) 40 MG tablet   Hyperlipidemia LDL goal <130   Relevant Medications   hydrochlorothiazide (HYDRODIURIL) 25 MG tablet   enalapril (VASOTEC) 5 MG tablet   atorvastatin (LIPITOR) 20 MG tablet   Post hysterectomy menopause   Relevant Medications   estradiol (ESTRACE) 1 MG tablet    Continue current medicine, will see how it goes with her trying to take the estrogen every 3 days.  She seems to still have some hot flash symptoms but not overwhelming. Follow up plan: Return in about 3 months (around  12/04/2022), or if symptoms worsen or fail to improve, for Hypertension and hyperlipidemia and anxiety recheck.  Counseling provided for all of the vaccine components No orders of the defined types were placed in this encounter.   Arville Care, MD [facility] 09/04/2022, 9:51 AM

## 2022-09-19 ENCOUNTER — Encounter: Payer: Self-pay | Admitting: *Deleted

## 2022-10-05 ENCOUNTER — Telehealth: Payer: Self-pay | Admitting: Internal Medicine

## 2022-10-05 NOTE — Telephone Encounter (Signed)
Received questionnaire and will need OV due to constipation.

## 2022-10-31 NOTE — Progress Notes (Signed)
Primary Care Physician:  Dettinger, Elige Radon, MD Primary Gastroenterologist:  Dr. Jena Gauss  Chief Complaint  Patient presents with   Colonoscopy    Colonoscopy screening    HPI:   Allison [name] is a 62 y.o. female presenting today to discuss scheduling screening colonoscopy.  She has family history significant for sister diagnosed with colorectal cancer at age 47.  Her last colonoscopy was in 2019 with diverticulosis only.  Recommended 5-year repeat exam.  Constipation was noted on her triage questionnaire and office visit was recommended.  Today: Reports she is doing well overall.  Has chronic mild constipation that is well controlled with 1/2-1/4 capful of MiraLAX daily.  No abdominal pain, BRBPR, melena, unintentional weight loss, nausea, vomiting, reflux symptoms, or dysphagia.   Past Medical History:  Diagnosis Date   Anxiety    Depression    Hypercholesteremia    Hypertension     Past Surgical History:  Procedure Laterality Date   ABDOMINAL HYSTERECTOMY     total hysterectomy with ovaries are removed.    ADENOIDECTOMY     ANKLE CLOSED REDUCTION     CESAREAN SECTION     X 3   COLONOSCOPY N/A 04/14/2013   Procedure: COLONOSCOPY;  Surgeon: Corbin Ade, MD;  Location: [facility];  Service: Endoscopy;  Laterality: N/A;  2:15 PM-moved to [address] notified pt   COLONOSCOPY N/A 10/23/2017   Procedure: COLONOSCOPY;  Surgeon: Corbin Ade, MD;  Location: [facility];  Service: Endoscopy;  Laterality: N/A;  11:15am   DILATION AND CURETTAGE OF UTERUS     HEMORROIDECTOMY      Current Outpatient Medications  Medication Sig Dispense Refill   ALPRAZolam (XANAX) 0.5 MG tablet Take 1 tablet (0.5 mg total) by mouth at bedtime as needed for anxiety. 30 tablet 0   atorvastatin (LIPITOR) 20 MG tablet Take 1 tablet (20 mg total) by mouth daily. 90 tablet 3   citalopram (CELEXA) 40 MG tablet Take 1 tablet (40 mg total) by mouth daily. 90 tablet 3   enalapril (VASOTEC) 5 MG  tablet Take 1 tablet (5 mg total) by mouth daily. 90 tablet 3   estradiol (ESTRACE) 1 MG tablet Take 1 tablet (1 mg total) by mouth daily. 90 tablet 3   hydrochlorothiazide (HYDRODIURIL) 25 MG tablet Take 1 tablet (25 mg total) by mouth daily. 90 tablet 3   valACYclovir (VALTREX) 1000 MG tablet Take 1 tablet (1,000 mg total) by mouth 2 (two) times daily. 20 tablet 1   No current facility-administered medications for this visit.    Allergies as of 11/03/2022   (No Known Allergies)    Family History  Problem Relation Age of Onset   Alzheimer's disease Mother    Heart attack Father    Colon cancer Sister        55s   Hypertension Sister    Diabetes Sister    Diabetes Brother    Hypertension Brother    Thyroid cancer Brother    Hypertension Brother     Social History   Socioeconomic History   Marital status: Married    Spouse name: Loraine Leriche   Number of children: 3   Years of education: Not on file   Highest education level: 12th grade  Occupational History   Not on file  Tobacco Use   Smoking status: Never   Smokeless tobacco: Never  Vaping Use   Vaping Use: Never used  Substance and Sexual Activity   Alcohol use:  No    Alcohol/week: 0.0 standard drinks of alcohol   Drug use: No   Sexual activity: Yes    Partners: Male    Birth control/protection: Surgical    Comment: married 39 years  Other Topics Concern   Not on file  Social History Narrative   Lives in [place]. Married for 39 years. 3 children. 2 grandchildren. Has 6 siblings. Mother is decreased.    Attends church. [employer].   Eats all food groups.   Wear seatbelt.   Works at [employer] in [employer].      Social Determinants of Health   Financial Resource Strain: Low Risk  (09/04/2022)   Overall Financial Resource Strain (CARDIA)    Difficulty of Paying Living Expenses: Not hard at all  Food Insecurity: Patient Declined (09/04/2022)   Hunger Vital Sign    Worried About Running Out  of Food in the Last Year: Patient declined    Ran Out of Food in the Last Year: Patient declined  Transportation Needs: Unknown (09/04/2022)   PRAPARE - Administrator, Civil Service (Medical): No    Lack of Transportation (Non-Medical): Patient declined  Physical Activity: Insufficiently Active (09/04/2022)   Exercise Vital Sign    Days of Exercise per Week: 1 day    Minutes of Exercise per Session: 20 min  Stress: No Stress Concern Present (09/04/2022)   [employer] of Occupational Health - Occupational Stress Questionnaire    Feeling of Stress : Not at all  Social Connections: Socially Integrated (09/04/2022)   Social Connection and Isolation Panel [NHANES]    Frequency of Communication with Friends and Family: Three times a week    Frequency of Social Gatherings with Friends and Family: Once a week    Attends Religious Services: More than 4 times per year    Active Member of [employer] or Organizations: Yes    Attends Banker Meetings: Never    Marital Status: Married  Catering manager Violence: Not on file    Review of Systems: Gen: Denies any fever, chills, cold or flulike symptoms, presyncope, syncope. CV: Denies chest pain, heart palpitations. Resp: Denies shortness of breath, cough. GI: See HPI GU : Denies urinary burning, urinary frequency, urinary hesitancy MS: Denies joint pain. Derm: Denies rash. Psych: Denies depression, anxiety. Heme: See HPI  Physical Exam: BP 136/86 (BP Location: Right Arm, Patient Position: Sitting, Cuff Size: Normal)   Pulse (!) 53   Temp 98.2 F (36.8 C) (Temporal)   Ht 5\' 1"  (1.549 m)   Wt 177 lb (80.3 kg)   SpO2 96%   BMI 33.44 kg/m  General:   Alert and oriented. Pleasant and cooperative. Well-nourished and well-developed.  Head:  Normocephalic and atraumatic. Eyes:  Without icterus, sclera clear and conjunctiva pink.  Ears:  Normal auditory acuity. Lungs:  Clear to auscultation bilaterally. No wheezes,  rales, or rhonchi. No distress.  Heart:  S1, S2 present without murmurs appreciated.  Abdomen:  +BS, soft, non-tender and non-distended. No HSM noted. No guarding or rebound. No masses appreciated.  Rectal:  Deferred  Msk:  Symmetrical without gross deformities. Normal posture. Extremities:  Without edema. Neurologic:  Alert and  oriented x4;  grossly normal neurologically. Skin:  Intact without significant lesions or rashes. Psych:  Normal mood and affect.    Assessment:  62 year old female with history of anxiety, depression, HTN, HLD, mild constipation, family history significant for sister with colon cancer in her 54s, presenting today to discuss  scheduling screening colonoscopy.  Her last colonoscopy was in 2019 with diverticulosis only, recommended 5-year repeat.  Currently without any significant GI symptoms or alarm symptoms.  She does have chronic mild constipation that is well-controlled with MiraLAX daily.    Plan:  Proceed with colonoscopy with propofol by Dr. Jena Gauss in near future. The risks, benefits, and alternatives have been discussed with the patient in detail. The patient states understanding and desires to proceed.  ASA 2 BMP prior Continue MiraLAX daily.  Follow-up PRN.    Ermalinda Memos, PA-C [facility] 11/03/2022

## 2022-10-31 NOTE — H&P (View-Only) (Signed)
Primary Care Physician:  Dettinger, Elige Radon, MD Primary Gastroenterologist:  Dr. Jena Gauss  Chief Complaint  Patient presents with   Colonoscopy    Colonoscopy screening    HPI:   Allison [name] is a 62 y.o. female presenting today to discuss scheduling screening colonoscopy.  She has family history significant for sister diagnosed with colorectal cancer at age 47.  Her last colonoscopy was in 2019 with diverticulosis only.  Recommended 5-year repeat exam.  Constipation was noted on her triage questionnaire and office visit was recommended.  Today: Reports she is doing well overall.  Has chronic mild constipation that is well controlled with 1/2-1/4 capful of MiraLAX daily.  No abdominal pain, BRBPR, melena, unintentional weight loss, nausea, vomiting, reflux symptoms, or dysphagia.   Past Medical History:  Diagnosis Date   Anxiety    Depression    Hypercholesteremia    Hypertension     Past Surgical History:  Procedure Laterality Date   ABDOMINAL HYSTERECTOMY     total hysterectomy with ovaries are removed.    ADENOIDECTOMY     ANKLE CLOSED REDUCTION     CESAREAN SECTION     X 3   COLONOSCOPY N/A 04/14/2013   Procedure: COLONOSCOPY;  Surgeon: Corbin Ade, MD;  Location: [facility];  Service: Endoscopy;  Laterality: N/A;  2:15 PM-moved to [address] notified pt   COLONOSCOPY N/A 10/23/2017   Procedure: COLONOSCOPY;  Surgeon: Corbin Ade, MD;  Location: [facility];  Service: Endoscopy;  Laterality: N/A;  11:15am   DILATION AND CURETTAGE OF UTERUS     HEMORROIDECTOMY      Current Outpatient Medications  Medication Sig Dispense Refill   ALPRAZolam (XANAX) 0.5 MG tablet Take 1 tablet (0.5 mg total) by mouth at bedtime as needed for anxiety. 30 tablet 0   atorvastatin (LIPITOR) 20 MG tablet Take 1 tablet (20 mg total) by mouth daily. 90 tablet 3   citalopram (CELEXA) 40 MG tablet Take 1 tablet (40 mg total) by mouth daily. 90 tablet 3   enalapril (VASOTEC) 5 MG  tablet Take 1 tablet (5 mg total) by mouth daily. 90 tablet 3   estradiol (ESTRACE) 1 MG tablet Take 1 tablet (1 mg total) by mouth daily. 90 tablet 3   hydrochlorothiazide (HYDRODIURIL) 25 MG tablet Take 1 tablet (25 mg total) by mouth daily. 90 tablet 3   valACYclovir (VALTREX) 1000 MG tablet Take 1 tablet (1,000 mg total) by mouth 2 (two) times daily. 20 tablet 1   No current facility-administered medications for this visit.    Allergies as of 11/03/2022   (No Known Allergies)    Family History  Problem Relation Age of Onset   Alzheimer's disease Mother    Heart attack Father    Colon cancer Sister        55s   Hypertension Sister    Diabetes Sister    Diabetes Brother    Hypertension Brother    Thyroid cancer Brother    Hypertension Brother     Social History   Socioeconomic History   Marital status: Married    Spouse name: Loraine Leriche   Number of children: 3   Years of education: Not on file   Highest education level: 12th grade  Occupational History   Not on file  Tobacco Use   Smoking status: Never   Smokeless tobacco: Never  Vaping Use   Vaping Use: Never used  Substance and Sexual Activity   Alcohol use:  No    Alcohol/week: 0.0 standard drinks of alcohol   Drug use: No   Sexual activity: Yes    Partners: Male    Birth control/protection: Surgical    Comment: married 39 years  Other Topics Concern   Not on file  Social History Narrative   Lives in [place]. Married for 39 years. 3 children. 2 grandchildren. Has 6 siblings. Mother is decreased.    Attends church. [employer].   Eats all food groups.   Wear seatbelt.   Works at [employer] in [employer].      Social Determinants of Health   Financial Resource Strain: Low Risk  (09/04/2022)   Overall Financial Resource Strain (CARDIA)    Difficulty of Paying Living Expenses: Not hard at all  Food Insecurity: Patient Declined (09/04/2022)   Hunger Vital Sign    Worried About Running Out  of Food in the Last Year: Patient declined    Ran Out of Food in the Last Year: Patient declined  Transportation Needs: Unknown (09/04/2022)   PRAPARE - Administrator, Civil Service (Medical): No    Lack of Transportation (Non-Medical): Patient declined  Physical Activity: Insufficiently Active (09/04/2022)   Exercise Vital Sign    Days of Exercise per Week: 1 day    Minutes of Exercise per Session: 20 min  Stress: No Stress Concern Present (09/04/2022)   [employer] of Occupational Health - Occupational Stress Questionnaire    Feeling of Stress : Not at all  Social Connections: Socially Integrated (09/04/2022)   Social Connection and Isolation Panel [NHANES]    Frequency of Communication with Friends and Family: Three times a week    Frequency of Social Gatherings with Friends and Family: Once a week    Attends Religious Services: More than 4 times per year    Active Member of [employer] or Organizations: Yes    Attends Banker Meetings: Never    Marital Status: Married  Catering manager Violence: Not on file    Review of Systems: Gen: Denies any fever, chills, cold or flulike symptoms, presyncope, syncope. CV: Denies chest pain, heart palpitations. Resp: Denies shortness of breath, cough. GI: See HPI GU : Denies urinary burning, urinary frequency, urinary hesitancy MS: Denies joint pain. Derm: Denies rash. Psych: Denies depression, anxiety. Heme: See HPI  Physical Exam: BP 136/86 (BP Location: Right Arm, Patient Position: Sitting, Cuff Size: Normal)   Pulse (!) 53   Temp 98.2 F (36.8 C) (Temporal)   Ht 5\' 1"  (1.549 m)   Wt 177 lb (80.3 kg)   SpO2 96%   BMI 33.44 kg/m  General:   Alert and oriented. Pleasant and cooperative. Well-nourished and well-developed.  Head:  Normocephalic and atraumatic. Eyes:  Without icterus, sclera clear and conjunctiva pink.  Ears:  Normal auditory acuity. Lungs:  Clear to auscultation bilaterally. No wheezes,  rales, or rhonchi. No distress.  Heart:  S1, S2 present without murmurs appreciated.  Abdomen:  +BS, soft, non-tender and non-distended. No HSM noted. No guarding or rebound. No masses appreciated.  Rectal:  Deferred  Msk:  Symmetrical without gross deformities. Normal posture. Extremities:  Without edema. Neurologic:  Alert and  oriented x4;  grossly normal neurologically. Skin:  Intact without significant lesions or rashes. Psych:  Normal mood and affect.    Assessment:  62 year old female with history of anxiety, depression, HTN, HLD, mild constipation, family history significant for sister with colon cancer in her 54s, presenting today to discuss  scheduling screening colonoscopy.  Her last colonoscopy was in 2019 with diverticulosis only, recommended 5-year repeat.  Currently without any significant GI symptoms or alarm symptoms.  She does have chronic mild constipation that is well-controlled with MiraLAX daily.    Plan:  Proceed with colonoscopy with propofol by Dr. Jena Gauss in near future. The risks, benefits, and alternatives have been discussed with the patient in detail. The patient states understanding and desires to proceed.  ASA 2 BMP prior Continue MiraLAX daily.  Follow-up PRN.    Ermalinda Memos, PA-C [facility] 11/03/2022

## 2022-11-03 ENCOUNTER — Encounter: Payer: Self-pay | Admitting: *Deleted

## 2022-11-03 ENCOUNTER — Telehealth: Payer: Self-pay | Admitting: *Deleted

## 2022-11-03 ENCOUNTER — Other Ambulatory Visit: Payer: Self-pay | Admitting: *Deleted

## 2022-11-03 ENCOUNTER — Ambulatory Visit (INDEPENDENT_AMBULATORY_CARE_PROVIDER_SITE_OTHER): Payer: 59 | Admitting: Gastroenterology

## 2022-11-03 ENCOUNTER — Encounter: Payer: Self-pay | Admitting: Gastroenterology

## 2022-11-03 VITALS — BP 136/86 | HR 53 | Temp 98.2°F | Ht 61.0 in | Wt 177.0 lb

## 2022-11-03 DIAGNOSIS — Z1211 Encounter for screening for malignant neoplasm of colon: Secondary | ICD-10-CM | POA: Diagnosis not present

## 2022-11-03 DIAGNOSIS — Z800 Family history of malignant neoplasm of digestive organs: Secondary | ICD-10-CM

## 2022-11-03 NOTE — Patient Instructions (Signed)
We will arrange to have a colonoscopy in the near future with Dr. Jena Gauss.  Continue taking MiraLAX daily to manage her constipation.  We will follow-up with you in the office as needed.  It was very nice to meet you today!  Ermalinda Memos, PA-C [facility]

## 2022-11-03 NOTE — Telephone Encounter (Signed)
[employer] PA: CPT Code GECOL Description: Colonoscopy Case Number: [id number] Review Date: 11/03/2022 8:58:28 AM Expiration Date: N/A Status: This member is not in scope for prior-authorization/notification for the services requested. You can save the case reference ID as validation of your request.

## 2022-11-20 ENCOUNTER — Other Ambulatory Visit (HOSPITAL_COMMUNITY)
Admission: RE | Admit: 2022-11-20 | Discharge: 2022-11-20 | Disposition: A | Discharge status: Home / Self Care | Payer: 59 | Source: Ambulatory Visit | Attending: Internal Medicine | Admitting: Internal Medicine

## 2022-11-20 ENCOUNTER — Other Ambulatory Visit: Payer: Self-pay

## 2022-11-20 DIAGNOSIS — Z1211 Encounter for screening for malignant neoplasm of colon: Secondary | ICD-10-CM | POA: Diagnosis present

## 2022-11-20 DIAGNOSIS — Z800 Family history of malignant neoplasm of digestive organs: Secondary | ICD-10-CM | POA: Diagnosis present

## 2022-11-20 LAB — BASIC METABOLIC PANEL
Anion gap: 9 (ref 5–15)
BUN: 12 mg/dL (ref 8–23)
CO2: 27 mmol/L (ref 22–32)
Calcium: 9.5 mg/dL (ref 8.9–10.3)
Chloride: 101 mmol/L (ref 98–111)
Creatinine, Ser: 0.7 mg/dL (ref 0.44–1.00)
GFR, Estimated: >60 mL/min (ref >60)
Glucose, Bld: 87 mg/dL (ref 70–99)
Potassium: 3 mmol/L — ABNORMAL LOW (ref 3.5–5.1)
Sodium: 137 mmol/L (ref 135–145)

## 2022-11-20 MED ORDER — POTASSIUM CHLORIDE CRYS ER 20 MEQ PO TBCR: 40.0000 meq | EXTENDED_RELEASE_TABLET | Freq: Two times a day (BID) | ORAL | 0 refills | Status: DC

## 2022-11-23 ENCOUNTER — Ambulatory Visit (HOSPITAL_COMMUNITY)
Admission: RE | Admit: 2022-11-23 | Discharge: 2022-11-23 | Disposition: A | Discharge status: Home / Self Care | Payer: 59 | Attending: Internal Medicine | Admitting: Internal Medicine

## 2022-11-23 ENCOUNTER — Other Ambulatory Visit: Payer: Self-pay

## 2022-11-23 ENCOUNTER — Ambulatory Visit (HOSPITAL_COMMUNITY): Payer: 59 | Admitting: Anesthesiology

## 2022-11-23 ENCOUNTER — Encounter (HOSPITAL_COMMUNITY): Payer: Self-pay | Admitting: Internal Medicine

## 2022-11-23 ENCOUNTER — Ambulatory Visit (HOSPITAL_BASED_OUTPATIENT_CLINIC_OR_DEPARTMENT_OTHER): Payer: 59 | Admitting: Anesthesiology

## 2022-11-23 ENCOUNTER — Encounter (HOSPITAL_COMMUNITY)
Admission: RE | Disposition: A | Discharge status: Home / Self Care | Payer: Self-pay | Source: Home / Self Care | Attending: Internal Medicine

## 2022-11-23 DIAGNOSIS — Z1211 Encounter for screening for malignant neoplasm of colon: Secondary | ICD-10-CM

## 2022-11-23 DIAGNOSIS — K5730 Diverticulosis of large intestine without perforation or abscess without bleeding: Secondary | ICD-10-CM

## 2022-11-23 DIAGNOSIS — Z800 Family history of malignant neoplasm of digestive organs: Secondary | ICD-10-CM | POA: Diagnosis not present

## 2022-11-23 HISTORY — PX: COLONOSCOPY WITH PROPOFOL: SHX5780

## 2022-11-23 SURGERY — COLONOSCOPY WITH PROPOFOL
Anesthesia: General

## 2022-11-23 MED ORDER — LIDOCAINE HCL (CARDIAC) PF 100 MG/5ML IV SOSY
PREFILLED_SYRINGE | INTRAVENOUS | Status: DC | PRN
  Administered 2022-11-23: 50 mg via INTRAVENOUS

## 2022-11-23 MED ORDER — PROPOFOL 500 MG/50ML IV EMUL
INTRAVENOUS | Status: DC | PRN
  Administered 2022-11-23: 150 ug/kg/min via INTRAVENOUS

## 2022-11-23 MED ORDER — LACTATED RINGERS IV SOLN
INTRAVENOUS | Status: DC
  Administered 2022-11-23: 1000 mL via INTRAVENOUS

## 2022-11-23 MED ORDER — PROPOFOL 10 MG/ML IV BOLUS
INTRAVENOUS | Status: DC | PRN
  Administered 2022-11-23: 100 mg via INTRAVENOUS

## 2022-11-23 NOTE — Anesthesia Preprocedure Evaluation (Signed)
Anesthesia Evaluation  Patient identified by MRN, date of birth, ID band Patient awake    Reviewed: Allergy & Precautions, H&P , NPO status , Patient's Chart, lab work & pertinent test results  Airway Mallampati: II  TM Distance: >3 FB Neck ROM: Full    Dental  (+) Caps, Dental Advisory Given   Pulmonary neg pulmonary ROS   Pulmonary exam normal breath sounds clear to auscultation       Cardiovascular hypertension, Pt. on medications Normal cardiovascular exam Rhythm:Regular Rate:Normal     Neuro/Psych negative neurological ROS  negative psych ROS   GI/Hepatic negative GI ROS, Neg liver ROS,,,  Endo/Other  negative endocrine ROS    Renal/GU negative Renal ROS  negative genitourinary   Musculoskeletal negative musculoskeletal ROS (+)    Abdominal   Peds negative pediatric ROS (+)  Hematology negative hematology ROS (+)   Anesthesia Other Findings   Reproductive/Obstetrics negative OB ROS                             Anesthesia Physical Anesthesia Plan  ASA: 2  Anesthesia Plan: General   Post-op Pain Management: Minimal or no pain anticipated   Induction: Intravenous  PONV Risk Score and Plan: 1 and Propofol infusion  Airway Management Planned: Nasal Cannula and Natural Airway  Additional Equipment:   Intra-op Plan:   Post-operative Plan:   Informed Consent: I have reviewed the patients History and Physical, chart, labs and discussed the procedure including the risks, benefits and alternatives for the proposed anesthesia with the patient or authorized representative who has indicated his/her understanding and acceptance.     Dental advisory given  Plan Discussed with: CRNA and Surgeon  Anesthesia Plan Comments:        Anesthesia Quick Evaluation

## 2022-11-23 NOTE — Anesthesia Postprocedure Evaluation (Signed)
Anesthesia Post Note  Patient: AKILAH CURETON  Procedure(s) Performed: COLONOSCOPY WITH PROPOFOL  Patient location during evaluation: Phase II Anesthesia Type: General Level of consciousness: awake and alert and oriented Pain management: pain level controlled Vital Signs Assessment: post-procedure vital signs reviewed and stable Respiratory status: spontaneous breathing, nonlabored ventilation and respiratory function stable Cardiovascular status: blood pressure returned to baseline and stable Postop Assessment: no apparent nausea or vomiting Anesthetic complications: no  No notable events documented.   Last Vitals:  Vitals:   11/23/22 0921 11/23/22 1136  BP: 133/73 (!) 106/95  Pulse: 61 (!) 53  Resp: 15 18  Temp:  36.6 C  SpO2: 96% 97%    Last Pain:  Vitals:   11/23/22 1136  TempSrc: Oral  PainSc: 0-No pain                 Zyla Dascenzo C Emoni Yang

## 2022-11-23 NOTE — Transfer of Care (Signed)
Immediate Anesthesia Transfer of Care Note  Patient: Allison [name]  Procedure(s) Performed: COLONOSCOPY WITH PROPOFOL  Patient Location: PACU  Anesthesia Type:General  Level of Consciousness: awake, alert , and oriented  Airway & Oxygen Therapy: Patient Spontanous Breathing  Post-op Assessment: Report given to RN and Post -op Vital signs reviewed and stable  Post vital signs: Reviewed and stable  Last Vitals:  Vitals Value Taken Time  BP    Temp    Pulse    Resp    SpO2      Last Pain:  Vitals:   11/23/22 1057  TempSrc:   PainSc: 0-No pain      Patients Stated Pain Goal: 4 (11/23/22 0921)  Complications: No notable events documented.

## 2022-11-23 NOTE — Anesthesia Procedure Notes (Signed)
Date/Time: 11/23/2022 11:23 AM  Performed by: Julian Reil, CRNAPre-anesthesia Checklist: Patient identified, Emergency Drugs available, Suction available and Patient being monitored Patient Re-evaluated:Patient Re-evaluated prior to induction Oxygen Delivery Method: Nasal cannula Induction Type: IV induction Placement Confirmation: positive ETCO2

## 2022-11-23 NOTE — Op Note (Signed)
[facility] Patient Name: Allison [name] Procedure Date: 11/23/2022 10:57 AM MRN: [id number] Date of Birth: [birth year] Attending MD: Gennette Pac , MD, [id number] CSN: [id number] Age: 62 Admit Type: Outpatient Procedure:                Colonoscopy Indications:              Screening in patient at increased risk: Family                            history of 1st-degree relative with colorectal                            cancer before age 62 years Providers:                Gennette Pac, MD, Enzo Montgomery RN, RN,                            Lennice Sites Technician, Technician Referring MD:             Gennette Pac, MD Medicines:                Propofol per Anesthesia Complications:            No immediate complications. Estimated Blood Loss:     Estimated blood loss: none. Procedure:                Pre-Anesthesia Assessment:                           - Prior to the procedure, a History and Physical                            was performed, and patient medications and                            allergies were reviewed. The patient's tolerance of                            previous anesthesia was also reviewed. The risks                            and benefits of the procedure and the sedation                            options and risks were discussed with the patient.                            All questions were answered, and informed consent                            was obtained. Prior Anticoagulants: The patient has                            taken no anticoagulant or antiplatelet agents. ASA  Grade Assessment: II - A patient with mild systemic                            disease. After reviewing the risks and benefits,                            the patient was deemed in satisfactory condition to                            undergo the procedure.                           After obtaining informed consent, the colonoscope                             was passed under direct vision. Throughout the                            procedure, the patient's blood pressure, pulse, and                            oxygen saturations were monitored continuously. The                            [id number]) scope was introduced through the                            anus and advanced to the the cecum, identified by                            appendiceal orifice and ileocecal valve. The                            colonoscopy was performed without difficulty. The                            patient tolerated the procedure well. The quality                            of the bowel preparation was adequate. The                            ileocecal valve, appendiceal orifice, and rectum                            were photographed. The entire colon was well                            visualized. Scope In: 11:22:50 AM Scope Out: 11:32:36 AM Scope Withdrawal Time: 0 hours 6 minutes 5 seconds  Total Procedure Duration: 0 hours 9 minutes 46 seconds  Findings:      The perianal and digital rectal examinations were normal.      Scattered medium-mouthed diverticula were found in the sigmoid colon and  descending colon.      The exam was otherwise without abnormality on direct and retroflexion       views. Impression:               - Diverticulosis in the sigmoid colon and in the                            descending colon.                           - The examination was otherwise normal on direct                            and retroflexion views.                           - No specimens collected. Moderate Sedation:      Moderate (conscious) sedation was personally administered by an       anesthesia professional. The following parameters were monitored: oxygen       saturation, heart rate, blood pressure, respiratory rate, EKG, adequacy       of pulmonary ventilation, and response to care. Recommendation:           - Patient has a  contact number available for                            emergencies. The signs and symptoms of potential                            delayed complications were discussed with the                            patient. Return to normal activities tomorrow.                            Written discharge instructions were provided to the                            patient.                           - Resume previous diet.                           - Continue present medications.                           - Repeat colonoscopy in 5 years for screening                            purposes.                           - Return to GI office (date not yet determined). Procedure Code(s):        --- Professional ---  40981, Colonoscopy, flexible; diagnostic, including                            collection of specimen(s) by brushing or washing,                            when performed (separate procedure) Diagnosis Code(s):        --- Professional ---                           Z80.0, Family history of malignant neoplasm of                            digestive organs                           K57.30, Diverticulosis of large intestine without                            perforation or abscess without bleeding CPT copyright 2022 American Medical Association. All rights reserved. The codes documented in this report are preliminary and upon coder review may  be revised to meet current compliance requirements. Gerrit Friends. Serra Younan, MD Gennette Pac, MD 11/23/2022 11:39:11 AM This report has been signed electronically. Number of Addenda: 0

## 2022-11-23 NOTE — Interval H&P Note (Signed)
History and Physical Interval Note:  11/23/2022 11:10 AM  Allison [name]  has presented today for surgery, with the diagnosis of screening, FHX: colon cancer.  The various methods of treatment have been discussed with the patient and family. After consideration of risks, benefits and other options for treatment, the patient has consented to  Procedure(s) with comments: COLONOSCOPY WITH PROPOFOL (N/A) - 10:30 am, asa 2 as a surgical intervention.  The patient's history has been reviewed, patient examined, no change in status, stable for surgery.  I have reviewed the patient's chart and labs.  Questions were answered to the patient's satisfaction.     Darlyn Repsher    No change.  High risk screening colonoscopy per plan.The risks, benefits, limitations, alternatives and imponderables have been reviewed with the patient. Questions have been answered. All parties are agreeable.

## 2022-11-23 NOTE — Discharge Instructions (Addendum)
  Colonoscopy Discharge Instructions  Read the instructions outlined below and refer to this sheet in the next few weeks. These discharge instructions provide you with general information on caring for yourself after you leave the hospital. Your doctor may also give you specific instructions. While your treatment has been planned according to the most current medical practices available, unavoidable complications occasionally occur. If you have any problems or questions after discharge, call Dr. Jena Gauss at [phone]. ACTIVITY You may resume your regular activity, but move at a slower pace for the next 24 hours.  Take frequent rest periods for the next 24 hours.  Walking will help get rid of the air and reduce the bloated feeling in your belly (abdomen).  No driving for 24 hours (because of the medicine (anesthesia) used during the test).   Do not sign any important legal documents or operate any machinery for 24 hours (because of the anesthesia used during the test).  NUTRITION Drink plenty of fluids.  You may resume your normal diet as instructed by your doctor.  Begin with a light meal and progress to your normal diet. Heavy or fried foods are harder to digest and may make you feel sick to your stomach (nauseated).  Avoid alcoholic beverages for 24 hours or as instructed.  MEDICATIONS You may resume your normal medications unless your doctor tells you otherwise.  WHAT YOU CAN EXPECT TODAY Some feelings of bloating in the abdomen.  Passage of more gas than usual.  Spotting of blood in your stool or on the toilet paper.  IF YOU HAD POLYPS REMOVED DURING THE COLONOSCOPY: No aspirin products for 7 days or as instructed.  No alcohol for 7 days or as instructed.  Eat a soft diet for the next 24 hours.  FINDING OUT THE RESULTS OF YOUR TEST Not all test results are available during your visit. If your test results are not back during the visit, make an appointment with your caregiver to find out the  results. Do not assume everything is normal if you have not heard from your caregiver or the medical facility. It is important for you to follow up on all of your test results.  SEEK IMMEDIATE MEDICAL ATTENTION IF: You have more than a spotting of blood in your stool.  Your belly is swollen (abdominal distention).  You are nauseated or vomiting.  You have a temperature over 101.  You have abdominal pain or discomfort that is severe or gets worse throughout the day.       Diverticulosis only found today.  No polyps.  Recommend repeat colonoscopy in 5 years.   at patient request, I called Girtha Rm at [phone] rolled to voicemail.  Left a message.

## 2022-11-29 ENCOUNTER — Encounter (HOSPITAL_COMMUNITY): Payer: Self-pay | Admitting: Internal Medicine

## 2022-12-07 ENCOUNTER — Ambulatory Visit (INDEPENDENT_AMBULATORY_CARE_PROVIDER_SITE_OTHER): Payer: 59 | Admitting: Family Medicine

## 2022-12-07 ENCOUNTER — Encounter: Payer: Self-pay | Admitting: Family Medicine

## 2022-12-07 VITALS — BP 133/78 | HR 61 | Ht 61.0 in | Wt 179.0 lb

## 2022-12-07 DIAGNOSIS — F419 Anxiety disorder, unspecified: Secondary | ICD-10-CM | POA: Diagnosis not present

## 2022-12-07 DIAGNOSIS — I10 Essential (primary) hypertension: Secondary | ICD-10-CM | POA: Diagnosis not present

## 2022-12-07 DIAGNOSIS — F411 Generalized anxiety disorder: Secondary | ICD-10-CM

## 2022-12-07 DIAGNOSIS — B001 Herpesviral vesicular dermatitis: Secondary | ICD-10-CM | POA: Diagnosis not present

## 2022-12-07 DIAGNOSIS — E785 Hyperlipidemia, unspecified: Secondary | ICD-10-CM

## 2022-12-07 MED ORDER — ALPRAZOLAM 0.5 MG PO TABS
0.5000 mg | ORAL_TABLET | Freq: Every evening | ORAL | 0 refills | Status: DC | PRN
Start: 2022-12-07 — End: 2023-03-19

## 2022-12-07 MED ORDER — VALACYCLOVIR HCL 1 G PO TABS
1000.0000 mg | ORAL_TABLET | Freq: Two times a day (BID) | ORAL | 1 refills | Status: DC
Start: 2022-12-07 — End: 2024-03-26

## 2022-12-07 NOTE — Progress Notes (Signed)
BP 133/78   Pulse 61   Ht 5\' 1"  (1.549 m)   Wt 179 lb (81.2 kg)   SpO2 95%   BMI 33.82 kg/m    Subjective:   Patient ID: Allison [name], female    DOB: 17-Apr-1961, 62 y.o.   MRN: [id number]  HPI: Allison [name] is a 62 y.o. female presenting on 12/07/2022 for Medical Management of Chronic Issues, Hypertension, and Hyperlipidemia   HPI Anxiety recheck Current rx-alprazolam 0.5 mg nightly as needed # meds rx-30, usually lasts about 6 months Effectiveness of current meds-works well, uses infrequently Adverse reactions form meds-none Pill count performed-No Last drug screen -06/13/2022 ( high risk q100m, moderate risk q11m, low risk yearly ) Urine drug screen today- No Was the NCCSR reviewed-yes  If yes were their any concerning findings? -None Controlled substance contract signed on: 06/13/2022  Hypertension Patient is currently on enalapril and hydrochlorothiazide, and their blood pressure today is 133/78. Patient denies any lightheadedness or dizziness. Patient denies headaches, blurred vision, chest pains, shortness of breath, or weakness. Denies any side effects from medication and is content with current medication.   Hyperlipidemia Patient is coming in for recheck of his hyperlipidemia. The patient is currently taking atorvastatin. They deny any issues with myalgias or history of liver damage from it. They deny any focal numbness or weakness or chest pain.   Patient gets recurrent cold sores and keeps Valtrex on hand for when they flareup.  Patient is taking estradiol for hormone replacement 3 times a day.  Hypokalemia recheck Patient has found some hypokalemia recently.  She says that it has been down recently.  She is not currently taking the potassium because she ran out and has not been for some time so we will check it today.  Relevant past medical, surgical, family and social history reviewed and updated as indicated. Interim medical history since our last visit  reviewed. Allergies and medications reviewed and updated.  Review of Systems  Constitutional:  Negative for chills and fever.  Eyes:  Negative for visual disturbance.  Respiratory:  Negative for chest tightness and shortness of breath.   Cardiovascular:  Negative for chest pain and leg swelling.  Genitourinary:  Negative for difficulty urinating and dysuria.  Musculoskeletal:  Negative for back pain and gait problem.  Skin:  Negative for rash.  Neurological:  Negative for dizziness, light-headedness and headaches.  Psychiatric/Behavioral:  Negative for agitation and behavioral problems.   All other systems reviewed and are negative.   Per HPI unless specifically indicated above   Allergies as of 12/07/2022   No Known Allergies      Medication List        Accurate as of December 07, 2022 11:04 AM. If you have any questions, ask your nurse or doctor.          STOP taking these medications    cholecalciferol 25 MCG (1000 UNIT) tablet Commonly known as: VITAMIN D3 Stopped by: Elige Radon Leyna Vanderkolk       TAKE these medications    ALPRAZolam 0.5 MG tablet Commonly known as: XANAX Take 1 tablet (0.5 mg total) by mouth at bedtime as needed for anxiety.   atorvastatin 20 MG tablet Commonly known as: LIPITOR Take 1 tablet (20 mg total) by mouth daily.   citalopram 40 MG tablet Commonly known as: CELEXA Take 1 tablet (40 mg total) by mouth daily.   enalapril 5 MG tablet Commonly known as: VASOTEC Take 1 tablet (5 mg total) by  mouth daily.   estradiol 1 MG tablet Commonly known as: ESTRACE Take 1 tablet (1 mg total) by mouth daily.   hydrochlorothiazide 25 MG tablet Commonly known as: HYDRODIURIL Take 1 tablet (25 mg total) by mouth daily.   multivitamin with minerals tablet Take 1 tablet by mouth daily.   potassium chloride SA 20 MEQ tablet Commonly known as: KLOR-CON M Take 2 tablets (40 mEq total) by mouth 2 (two) times daily.   valACYclovir 1000 MG  tablet Commonly known as: VALTREX Take 1 tablet (1,000 mg total) by mouth 2 (two) times daily. What changed:  when to take this reasons to take this         Objective:   BP 133/78   Pulse 61   Ht 5\' 1"  (1.549 m)   Wt 179 lb (81.2 kg)   SpO2 95%   BMI 33.82 kg/m   Wt Readings from Last 3 Encounters:  12/07/22 179 lb (81.2 kg)  11/23/22 174 lb (78.9 kg)  11/03/22 177 lb (80.3 kg)    Physical Exam Vitals and nursing note reviewed.  Constitutional:      General: She is not in acute distress.    Appearance: She is well-developed. She is not diaphoretic.  Eyes:     Conjunctiva/sclera: Conjunctivae normal.  Cardiovascular:     Rate and Rhythm: Normal rate and regular rhythm.     Heart sounds: Normal heart sounds. No murmur heard. Pulmonary:     Effort: Pulmonary effort is normal. No respiratory distress.     Breath sounds: Normal breath sounds. No wheezing.  Musculoskeletal:        General: No tenderness. Normal range of motion.  Skin:    General: Skin is warm and dry.     Findings: No rash.  Neurological:     Mental Status: She is alert and oriented to person, place, and time.     Coordination: Coordination normal.  Psychiatric:        Behavior: Behavior normal.       Assessment & Plan:   Problem List Items Addressed This Visit       Cardiovascular and Mediastinum   Essential hypertension - Primary   Relevant Orders   CBC with Differential/Platelet   CMP14+EGFR     Other   GAD (generalized anxiety disorder)   Relevant Medications   ALPRAZolam (XANAX) 0.5 MG tablet   Other Relevant Orders   CBC with Differential/Platelet   CMP14+EGFR   Hyperlipidemia LDL goal <130   Relevant Orders   Lipid panel   Other Visit Diagnoses     Anxiety       Relevant Medications   ALPRAZolam (XANAX) 0.5 MG tablet   Cold sore       Relevant Medications   valACYclovir (VALTREX) 1000 MG tablet     Recheck potassium today, she is currently not on it or not taking  it.  Follow up plan: Return in about 3 months (around 03/09/2023), or if symptoms worsen or fail to improve, for Potassium and hypertension recheck.  Counseling provided for all of the vaccine components Orders Placed This Encounter  Procedures   CBC with Differential/Platelet   CMP14+EGFR   Lipid panel    Arville Care, MD [facility] 12/07/2022, 11:04 AM

## 2023-03-19 ENCOUNTER — Encounter: Payer: Self-pay | Admitting: Family Medicine

## 2023-03-19 ENCOUNTER — Ambulatory Visit: Payer: 59 | Admitting: Family Medicine

## 2023-03-19 VITALS — BP 114/76 | HR 60 | Ht 61.0 in | Wt 178.0 lb

## 2023-03-19 DIAGNOSIS — F411 Generalized anxiety disorder: Secondary | ICD-10-CM | POA: Diagnosis not present

## 2023-03-19 DIAGNOSIS — F419 Anxiety disorder, unspecified: Secondary | ICD-10-CM | POA: Diagnosis not present

## 2023-03-19 DIAGNOSIS — E785 Hyperlipidemia, unspecified: Secondary | ICD-10-CM | POA: Diagnosis not present

## 2023-03-19 DIAGNOSIS — I10 Essential (primary) hypertension: Secondary | ICD-10-CM

## 2023-03-19 MED ORDER — ALPRAZOLAM 0.5 MG PO TABS
0.5000 mg | ORAL_TABLET | Freq: Every evening | ORAL | 0 refills | Status: DC | PRN
Start: 2023-03-19 — End: 2023-09-27

## 2023-03-19 NOTE — Progress Notes (Signed)
BP 114/76   Pulse 60   Ht 5\' 1"  (1.549 m)   Wt 178 lb (80.7 kg)   SpO2 93%   BMI 33.63 kg/m    Subjective:   Patient ID: Allison [name], female    DOB: 10/24/1960, 62 y.o.   MRN: [id number]  HPI: Allison [name] is a 62 y.o. female presenting on 03/19/2023 for Medical Management of Chronic Issues, Anxiety, Hypertension, and Hyperlipidemia  Hypertension Patient is currently taking enalapril and HCTZ. Her blood pressure today is 114/76. Blood pressure at home averages around 130-140s systolic. She denies lightheadedness or dizziness, headaches, vision changes, chest pain, or shortness of breath.    Hyperlipidemia Patient is currently taking atorvastatin. She denies myalgias or weakness. She does not have a history of liver damage from it.   Anxiety Patient is currently taking citalopram daily and alprazolam 0.5 mg nightly PRN. She reports needing the alprazolam about once weekly to help her fall asleep. Otherwise, she denies any issues with concentration, irritability, restlessness, or constant worry/stress. Current rx- alprazolam 0.5 mg nightly as needed # meds rx- 30 Effectiveness of current meds-works well, uses about once weekly Adverse reactions form meds-no Pill count performed-No Last drug screen - 06/13/2022 ( high risk q86m, moderate risk q46m, low risk yearly ) Urine drug screen today- No Was the NCCSR reviewed- yes  If yes were their any concerning findings? - No Controlled substance contract signed on: 06/13/2022   Hx of hypokalemia Patient has a history of hypokalemia. She is not currently taking supplemental potassium since her levels were normal at the last visit. She would like to recheck potassium today.  Relevant past medical, surgical, family and social history reviewed and updated as indicated. Interim medical history since our last visit reviewed. Allergies and medications reviewed and updated.  Review of Systems  Constitutional:  Negative for chills, fatigue and  fever.  Eyes:  Negative for visual disturbance.  Respiratory:  Negative for chest tightness and shortness of breath.   Cardiovascular:  Negative for chest pain, palpitations and leg swelling.  Gastrointestinal:  Negative for abdominal pain, constipation and diarrhea.  Genitourinary:  Negative for difficulty urinating, dysuria and frequency.  Musculoskeletal:  Negative for myalgias.  Neurological:  Negative for dizziness, syncope, weakness, light-headedness and headaches.  Psychiatric/Behavioral:  Positive for sleep disturbance. Negative for agitation and decreased concentration. The patient is not nervous/anxious.        Occasional difficulties with falling asleep at night    Per HPI unless specifically indicated above   Allergies as of 03/19/2023   No Known Allergies      Medication List        Accurate as of March 19, 2023 10:36 AM. If you have any questions, ask your nurse or doctor.          STOP taking these medications    potassium chloride SA 20 MEQ tablet Commonly known as: KLOR-CON M Stopped by: Elige Radon Kalina Morabito       TAKE these medications    ALPRAZolam 0.5 MG tablet Commonly known as: XANAX Take 1 tablet (0.5 mg total) by mouth at bedtime as needed for anxiety.   atorvastatin 20 MG tablet Commonly known as: LIPITOR Take 1 tablet (20 mg total) by mouth daily.   citalopram 40 MG tablet Commonly known as: CELEXA Take 1 tablet (40 mg total) by mouth daily.   enalapril 5 MG tablet Commonly known as: VASOTEC Take 1 tablet (5 mg total) by mouth daily.  estradiol 1 MG tablet Commonly known as: ESTRACE Take 1 tablet (1 mg total) by mouth daily.   hydrochlorothiazide 25 MG tablet Commonly known as: HYDRODIURIL Take 1 tablet (25 mg total) by mouth daily.   multivitamin with minerals tablet Take 1 tablet by mouth daily.   valACYclovir 1000 MG tablet Commonly known as: VALTREX Take 1 tablet (1,000 mg total) by mouth 2 (two) times daily.          Objective:   BP 114/76   Pulse 60   Ht 5\' 1"  (1.549 m)   Wt 178 lb (80.7 kg)   SpO2 93%   BMI 33.63 kg/m   Wt Readings from Last 3 Encounters:  03/19/23 178 lb (80.7 kg)  12/07/22 179 lb (81.2 kg)  11/23/22 174 lb (78.9 kg)    Physical Exam Vitals and nursing note reviewed.  Constitutional:      General: She is not in acute distress.    Appearance: Normal appearance. She is obese.  HENT:     Head: Normocephalic and atraumatic.     Right Ear: External ear normal.     Left Ear: External ear normal.  Eyes:     Conjunctiva/sclera: Conjunctivae normal.  Cardiovascular:     Rate and Rhythm: Normal rate and regular rhythm.     Heart sounds: Normal heart sounds.  Pulmonary:     Effort: Pulmonary effort is normal. No respiratory distress.     Breath sounds: Normal breath sounds.  Abdominal:     General: Abdomen is flat. There is no distension.     Palpations: Abdomen is soft.     Tenderness: There is no abdominal tenderness.  Musculoskeletal:     Cervical back: Normal range of motion and neck supple. No tenderness.     Right lower leg: No edema.     Left lower leg: No edema.  Skin:    General: Skin is warm and dry.  Neurological:     Mental Status: She is alert and oriented to person, place, and time.  Psychiatric:        Mood and Affect: Mood normal.        Behavior: Behavior normal.        Thought Content: Thought content normal.        Judgment: Judgment normal.     Assessment & Plan:   Problem List Items Addressed This Visit       Cardiovascular and Mediastinum   Essential hypertension - Primary   Relevant Orders   CBC with Differential/Platelet   CMP14+EGFR   Lipid panel     Other   GAD (generalized anxiety disorder)   Relevant Medications   ALPRAZolam (XANAX) 0.5 MG tablet   Other Relevant Orders   CBC with Differential/Platelet   CMP14+EGFR   Lipid panel   Hyperlipidemia LDL goal <130   Relevant Orders   CBC with Differential/Platelet    CMP14+EGFR   Lipid panel   Other Visit Diagnoses     Anxiety       Relevant Medications   ALPRAZolam (XANAX) 0.5 MG tablet       Patient is doing well. She has no complaints. Blood pressure well-controlled at 114/76. Encouraged patient to bring home blood pressure machine to next visit to check accuracy given discrepancies between home and office readings. Will recheck CMP today since she takes hydrochlorothiazide and has a history of hypokalemia. Patient tolerating atorvastatin well. Will recheck lipid panel today. Anxiety is well-controlled with citalopram and alprazolam. Will send  refill for alprazolam since patient is using medicine infrequently.  Follow up plan: Return in about 3 months (around 06/19/2023), or if symptoms worsen or fail to improve, for Hypertension and hyperlipidemia and anxiety.  Counseling provided for all of the vaccine components Orders Placed This Encounter  Procedures   CBC with Differential/Platelet   CMP14+EGFR   Lipid panel    Gillermina Phy, Medical Student [facility] 03/19/2023, 10:36 AM  Patient seen and examined at [facility], medical student, agree with assessment plan above.  Patient is an occasional user of the problem but does not think consistently so we will continue with that.  She seems to be doing well blood pressure looks well.  No other changes today. Arville Care, MD [facility] 03/26/2023, 10:55 AM

## 2023-03-20 LAB — CBC WITH DIFFERENTIAL/PLATELET
Basophils Absolute: 0.1 10*3/uL (ref 0.0–0.2)
Basos: 1 %
EOS (ABSOLUTE): 0.1 10*3/uL (ref 0.0–0.4)
Eos: 3 %
Hematocrit: 43.9 % (ref 34.0–46.6)
Hemoglobin: 14.9 g/dL (ref 11.1–15.9)
Immature Grans (Abs): 0 10*3/uL (ref 0.0–0.1)
Immature Granulocytes: 0 %
Lymphocytes Absolute: 1.4 10*3/uL (ref 0.7–3.1)
Lymphs: 28 %
MCH: 29.6 pg (ref 26.6–33.0)
MCHC: 33.9 g/dL (ref 31.5–35.7)
MCV: 87 fL (ref 79–97)
Monocytes Absolute: 0.3 10*3/uL (ref 0.1–0.9)
Monocytes: 6 %
Neutrophils Absolute: 3.2 10*3/uL (ref 1.4–7.0)
Neutrophils: 62 %
Platelets: 290 10*3/uL (ref 150–450)
RBC: 5.03 x10E6/uL (ref 3.77–5.28)
RDW: 13.2 % (ref 11.7–15.4)
WBC: 5.2 10*3/uL (ref 3.4–10.8)

## 2023-03-20 LAB — CMP14+EGFR
ALT: 24 IU/L (ref 0–32)
AST: 29 IU/L (ref 0–40)
Albumin: 4.2 g/dL (ref 3.9–4.9)
Alkaline Phosphatase: 72 IU/L (ref 44–121)
BUN/Creatinine Ratio: 15 (ref 12–28)
BUN: 11 mg/dL (ref 8–27)
Bilirubin Total: 0.6 mg/dL (ref 0.0–1.2)
CO2: 24 mmol/L (ref 20–29)
Calcium: 9.6 mg/dL (ref 8.7–10.3)
Chloride: 105 mmol/L (ref 96–106)
Creatinine, Ser: 0.73 mg/dL (ref 0.57–1.00)
Globulin, Total: 2.1 g/dL (ref 1.5–4.5)
Glucose: 92 mg/dL (ref 70–99)
Potassium: 4.1 mmol/L (ref 3.5–5.2)
Sodium: 144 mmol/L (ref 134–144)
Total Protein: 6.3 g/dL (ref 6.0–8.5)
eGFR: 93 mL/min/{1.73_m2} (ref >59)

## 2023-03-20 LAB — LIPID PANEL
Chol/HDL Ratio: 2.4 ratio (ref 0.0–4.4)
Cholesterol, Total: 187 mg/dL (ref 100–199)
HDL: 77 mg/dL (ref >39)
LDL Chol Calc (NIH): 97 mg/dL (ref 0–99)
Triglycerides: 72 mg/dL (ref 0–149)
VLDL Cholesterol Cal: 13 mg/dL (ref 5–40)

## 2023-06-20 ENCOUNTER — Encounter: Payer: Self-pay | Admitting: Family Medicine

## 2023-06-20 ENCOUNTER — Ambulatory Visit (INDEPENDENT_AMBULATORY_CARE_PROVIDER_SITE_OTHER): Payer: 59 | Admitting: Family Medicine

## 2023-06-20 VITALS — BP 134/67 | HR 56 | Ht 61.0 in | Wt 183.0 lb

## 2023-06-20 DIAGNOSIS — I10 Essential (primary) hypertension: Secondary | ICD-10-CM

## 2023-06-20 DIAGNOSIS — F419 Anxiety disorder, unspecified: Secondary | ICD-10-CM | POA: Diagnosis not present

## 2023-06-20 DIAGNOSIS — E785 Hyperlipidemia, unspecified: Secondary | ICD-10-CM | POA: Diagnosis not present

## 2023-06-20 DIAGNOSIS — F411 Generalized anxiety disorder: Secondary | ICD-10-CM

## 2023-06-20 NOTE — Progress Notes (Signed)
BP 134/67   Pulse (!) 56   Ht 5\' 1"  (1.549 m)   Wt 183 lb (83 kg)   SpO2 99%   BMI 34.58 kg/m    Subjective:   Patient ID: Allison [name], female    DOB: Mar 12, 1961, 63 y.o.   MRN: [id number]  HPI: Allison [name] is a 63 y.o. female presenting on 06/20/2023 for Medical Management of Chronic Issues, Hypertension, and Hyperlipidemia   HPI Hypertension Patient is currently on enalapril and hydrochlorothiazide, and their blood pressure today is 134/67. Patient denies any lightheadedness or dizziness. Patient denies headaches, blurred vision, chest pains, shortness of breath, or weakness. Denies any side effects from medication and is content with current medication.   Hyperlipidemia Patient is coming in for recheck of his hyperlipidemia. The patient is currently taking atorvastatin. They deny any issues with myalgias or history of liver damage from it. They deny any focal numbness or weakness or chest pain.   Anxiety recheck Current rx-citalopram and the occasional alprazolam.  She says she is doing fine and rarely uses the alprazolam and does not need a refill today. # meds rx-alprazolam 30 mg, usually lasts for 3 to 6 months Effectiveness of current meds-works well Adverse reactions form meds-none Pill count performed-No Last drug screen -06/13/2022 ( high risk q65m, moderate risk q56m, low risk yearly ) Urine drug screen today- No Was the NCCSR reviewed-yes  If yes were their any concerning findings? -None Controlled substance contract signed on: 06/13/2022  Relevant past medical, surgical, family and social history reviewed and updated as indicated. Interim medical history since our last visit reviewed. Allergies and medications reviewed and updated.  Review of Systems  Constitutional:  Negative for chills and fever.  HENT:  Negative for congestion, ear discharge and ear pain.   Eyes:  Negative for redness and visual disturbance.  Respiratory:  Negative for chest tightness and  shortness of breath.   Cardiovascular:  Negative for chest pain and leg swelling.  Genitourinary:  Negative for difficulty urinating and dysuria.  Musculoskeletal:  Negative for back pain and gait problem.  Skin:  Negative for rash.  Neurological:  Negative for dizziness, light-headedness and headaches.  Psychiatric/Behavioral:  Negative for agitation and behavioral problems.   All other systems reviewed and are negative.   Per HPI unless specifically indicated above   Allergies as of 06/20/2023   No Known Allergies      Medication List        Accurate as of June 20, 2023 11:23 AM. If you have any questions, ask your nurse or doctor.          ALPRAZolam 0.5 MG tablet Commonly known as: XANAX Take 1 tablet (0.5 mg total) by mouth at bedtime as needed for anxiety.   atorvastatin 20 MG tablet Commonly known as: LIPITOR Take 1 tablet (20 mg total) by mouth daily.   citalopram 40 MG tablet Commonly known as: CELEXA Take 1 tablet (40 mg total) by mouth daily.   enalapril 5 MG tablet Commonly known as: VASOTEC Take 1 tablet (5 mg total) by mouth daily.   estradiol 1 MG tablet Commonly known as: ESTRACE Take 1 tablet (1 mg total) by mouth daily.   hydrochlorothiazide 25 MG tablet Commonly known as: HYDRODIURIL Take 1 tablet (25 mg total) by mouth daily.   multivitamin with minerals tablet Take 1 tablet by mouth daily.   valACYclovir 1000 MG tablet Commonly known as: VALTREX Take 1 tablet (1,000 mg total) by mouth  2 (two) times daily.         Objective:   BP 134/67   Pulse (!) 56   Ht 5\' 1"  (1.549 m)   Wt 183 lb (83 kg)   SpO2 99%   BMI 34.58 kg/m   Wt Readings from Last 3 Encounters:  06/20/23 183 lb (83 kg)  03/19/23 178 lb (80.7 kg)  12/07/22 179 lb (81.2 kg)    Physical Exam Vitals and nursing note reviewed.  Constitutional:      General: She is not in acute distress.    Appearance: She is well-developed. She is not diaphoretic.  Eyes:      Conjunctiva/sclera: Conjunctivae normal.  Cardiovascular:     Rate and Rhythm: Normal rate and regular rhythm.     Heart sounds: Normal heart sounds. No murmur heard. Pulmonary:     Effort: Pulmonary effort is normal. No respiratory distress.     Breath sounds: Normal breath sounds. No wheezing.  Musculoskeletal:        General: No swelling or tenderness. Normal range of motion.  Skin:    General: Skin is warm and dry.     Findings: No rash.  Neurological:     Mental Status: She is alert and oriented to person, place, and time.     Coordination: Coordination normal.  Psychiatric:        Behavior: Behavior normal.       Assessment & Plan:   Problem List Items Addressed This Visit       Cardiovascular and Mediastinum   Essential hypertension - Primary   Relevant Orders   CMP14+EGFR     Other   GAD (generalized anxiety disorder)   Relevant Orders   TSH   Hyperlipidemia LDL goal <130   Other Visit Diagnoses       Anxiety       Relevant Orders   TSH       Will do blood work on the way out today, she seems to be doing well.  Follow-up in 3 months.  Anxiety seems to be under control as well.  No changes Follow up plan: Return in about 3 months (around 09/17/2023), or if symptoms worsen or fail to improve, for hypertension and hyperlipidemia and anxiety.  Counseling provided for all of the vaccine components Orders Placed This Encounter  Procedures   CMP14+EGFR   TSH    Arville Care, MD [facility] 06/20/2023, 11:23 AM

## 2023-06-21 LAB — CMP14+EGFR
ALT: 23 [IU]/L (ref 0–32)
AST: 23 [IU]/L (ref 0–40)
Albumin: 4.5 g/dL (ref 3.9–4.9)
Alkaline Phosphatase: 75 [IU]/L (ref 44–121)
BUN/Creatinine Ratio: 20 (ref 12–28)
BUN: 13 mg/dL (ref 8–27)
Bilirubin Total: 0.6 mg/dL (ref 0.0–1.2)
CO2: 26 mmol/L (ref 20–29)
Calcium: 9.5 mg/dL (ref 8.7–10.3)
Chloride: 103 mmol/L (ref 96–106)
Creatinine, Ser: 0.65 mg/dL (ref 0.57–1.00)
Globulin, Total: 1.7 g/dL (ref 1.5–4.5)
Glucose: 88 mg/dL (ref 70–99)
Potassium: 3.4 mmol/L — ABNORMAL LOW (ref 3.5–5.2)
Sodium: 143 mmol/L (ref 134–144)
Total Protein: 6.2 g/dL (ref 6.0–8.5)
eGFR: 99 mL/min/{1.73_m2} (ref >59)

## 2023-06-21 LAB — TSH: TSH: 1.99 u[IU]/mL (ref 0.450–4.500)

## 2023-06-28 ENCOUNTER — Encounter: Payer: Self-pay | Admitting: Family Medicine

## 2023-08-28 ENCOUNTER — Other Ambulatory Visit: Payer: Self-pay | Admitting: Family Medicine

## 2023-08-28 DIAGNOSIS — I10 Essential (primary) hypertension: Secondary | ICD-10-CM

## 2023-08-28 DIAGNOSIS — F411 Generalized anxiety disorder: Secondary | ICD-10-CM

## 2023-08-30 LAB — HM MAMMOGRAPHY

## 2023-08-31 ENCOUNTER — Encounter: Payer: Self-pay | Admitting: Family Medicine

## 2023-09-06 ENCOUNTER — Other Ambulatory Visit: Payer: Self-pay | Admitting: Family Medicine

## 2023-09-06 DIAGNOSIS — I10 Essential (primary) hypertension: Secondary | ICD-10-CM

## 2023-09-17 ENCOUNTER — Encounter: Payer: Self-pay | Admitting: Family Medicine

## 2023-09-17 ENCOUNTER — Ambulatory Visit (INDEPENDENT_AMBULATORY_CARE_PROVIDER_SITE_OTHER): Payer: 59 | Admitting: Family Medicine

## 2023-09-17 VITALS — BP 127/80 | HR 61 | Temp 97.8°F | Ht 61.0 in | Wt 182.6 lb

## 2023-09-17 DIAGNOSIS — Z90710 Acquired absence of both cervix and uterus: Secondary | ICD-10-CM

## 2023-09-17 DIAGNOSIS — F411 Generalized anxiety disorder: Secondary | ICD-10-CM | POA: Diagnosis not present

## 2023-09-17 DIAGNOSIS — E785 Hyperlipidemia, unspecified: Secondary | ICD-10-CM | POA: Diagnosis not present

## 2023-09-17 DIAGNOSIS — I10 Essential (primary) hypertension: Secondary | ICD-10-CM

## 2023-09-17 DIAGNOSIS — F419 Anxiety disorder, unspecified: Secondary | ICD-10-CM

## 2023-09-17 DIAGNOSIS — E8940 Asymptomatic postprocedural ovarian failure: Secondary | ICD-10-CM

## 2023-09-17 MED ORDER — ATORVASTATIN CALCIUM 20 MG PO TABS
20.0000 mg | ORAL_TABLET | Freq: Every day | ORAL | 3 refills | Status: AC
Start: 2023-09-17 — End: ?

## 2023-09-17 MED ORDER — ESTRADIOL 1 MG PO TABS
1.0000 mg | ORAL_TABLET | Freq: Every day | ORAL | 3 refills | Status: AC
Start: 2023-09-17 — End: ?

## 2023-09-17 MED ORDER — CITALOPRAM HYDROBROMIDE 40 MG PO TABS
40.0000 mg | ORAL_TABLET | Freq: Every day | ORAL | 3 refills | Status: AC
Start: 2023-09-17 — End: ?

## 2023-09-17 MED ORDER — ENALAPRIL MALEATE 5 MG PO TABS
5.0000 mg | ORAL_TABLET | Freq: Every day | ORAL | 3 refills | Status: AC
Start: 2023-09-17 — End: ?

## 2023-09-17 MED ORDER — HYDROCHLOROTHIAZIDE 25 MG PO TABS
25.0000 mg | ORAL_TABLET | Freq: Every day | ORAL | 3 refills | Status: AC
Start: 2023-09-17 — End: ?

## 2023-09-17 NOTE — Progress Notes (Signed)
 BP 127/80   Pulse 61   Temp 97.8 F (36.6 C)   Ht 5\' 1"  (1.549 m)   Wt 182 lb 9.6 oz (82.8 kg)   SpO2 94%   BMI 34.50 kg/m    Subjective:   Patient ID: Allison [name], female    DOB: [birth year], 63 y.o.   MRN: [id number]  HPI: Allison [name] is a 63 y.o. female presenting on 09/17/2023 for Medical Management of Chronic Issues   HPI Hypertension Patient is currently on enalapril  and hydrochlorothiazide , and their blood pressure today is 127/80. Patient denies any lightheadedness or dizziness. Patient denies headaches, blurred vision, chest pains, shortness of breath, or weakness. Denies any side effects from medication and is content with current medication.   Hyperlipidemia Patient is coming in for recheck of his hyperlipidemia. The patient is currently taking atorvastatin . They deny any issues with myalgias or history of liver damage from it. They deny any focal numbness or weakness or chest pain.   Anxiety recheck Current rx-alprazolam  0.5 mg nightly as needed # meds rx-30 tablets, will last for months Effectiveness of current meds-works well.  She also takes citalopram  which helps maintain Adverse reactions form meds-none Pill count performed-No Last drug screen -06/13/2022 ( high risk q82m, moderate risk q55m, low risk yearly ) Urine drug screen today- Yes Was the [facility] reviewed-yes  If yes were their any concerning findings? -None Controlled substance contract signed on: Today  Post hysterectomy menopause Patient is coming in today for post hysterectomy menopause recheck.  She currently takes the estrogen 1 mg daily.  Feels like she is doing well.  Medication seems to be doing well for her.  No major issues or hot flashes.  Relevant past medical, surgical, family and social history reviewed and updated as indicated. Interim medical history since our last visit reviewed. Allergies and medications reviewed and updated.  Review of Systems  Constitutional:  Negative for  chills and fever.  HENT:  Negative for congestion, ear discharge and ear pain.   Eyes:  Negative for redness and visual disturbance.  Respiratory:  Negative for chest tightness and shortness of breath.   Cardiovascular:  Negative for chest pain and leg swelling.  Genitourinary:  Negative for difficulty urinating and dysuria.  Musculoskeletal:  Negative for arthralgias, back pain and gait problem.  Skin:  Negative for rash.  Neurological:  Negative for dizziness, light-headedness and headaches.  Psychiatric/Behavioral:  Negative for agitation and behavioral problems.   All other systems reviewed and are negative.   Per HPI unless specifically indicated above   Allergies as of 09/17/2023   No Known Allergies      Medication List        Accurate as of Sep 17, 2023 10:56 AM. If you have any questions, ask your nurse or doctor.          ALPRAZolam  0.5 MG tablet Commonly known as: XANAX  Take 1 tablet (0.5 mg total) by mouth at bedtime as needed for anxiety.   atorvastatin  20 MG tablet Commonly known as: LIPITOR Take 1 tablet (20 mg total) by mouth daily.   citalopram  40 MG tablet Commonly known as: CELEXA  Take 1 tablet (40 mg total) by mouth daily.   enalapril  5 MG tablet Commonly known as: VASOTEC  Take 1 tablet (5 mg total) by mouth daily.   estradiol  1 MG tablet Commonly known as: ESTRACE  Take 1 tablet (1 mg total) by mouth daily.   hydrochlorothiazide  25 MG tablet Commonly known as: HYDRODIURIL  Take  1 tablet (25 mg total) by mouth daily.   multivitamin with minerals tablet Take 1 tablet by mouth daily.   valACYclovir  1000 MG tablet Commonly known as: VALTREX  Take 1 tablet (1,000 mg total) by mouth 2 (two) times daily.         Objective:   BP 127/80   Pulse 61   Temp 97.8 F (36.6 C)   Ht 5\' 1"  (1.549 m)   Wt 182 lb 9.6 oz (82.8 kg)   SpO2 94%   BMI 34.50 kg/m   Wt Readings from Last 3 Encounters:  09/17/23 182 lb 9.6 oz (82.8 kg)  06/20/23  183 lb (83 kg)  03/19/23 178 lb (80.7 kg)    Physical Exam Vitals and nursing note reviewed.  Constitutional:      General: She is not in acute distress.    Appearance: She is well-developed. She is not diaphoretic.  Eyes:     Conjunctiva/sclera: Conjunctivae normal.  Cardiovascular:     Rate and Rhythm: Normal rate and regular rhythm.     Heart sounds: Normal heart sounds. No murmur heard. Pulmonary:     Effort: Pulmonary effort is normal. No respiratory distress.     Breath sounds: Normal breath sounds. No wheezing.  Musculoskeletal:        General: No swelling.  Skin:    General: Skin is warm and dry.     Findings: No rash.  Neurological:     Mental Status: She is alert and oriented to person, place, and time.     Coordination: Coordination normal.  Psychiatric:        Behavior: Behavior normal.       Assessment & Plan:   Problem List Items Addressed This Visit       Cardiovascular and Mediastinum   Essential hypertension - Primary   Relevant Medications   atorvastatin  (LIPITOR) 20 MG tablet   enalapril  (VASOTEC ) 5 MG tablet   hydrochlorothiazide  (HYDRODIURIL ) 25 MG tablet   Other Relevant Orders   CBC with Differential/Platelet   CMP14+EGFR     Other   GAD (generalized anxiety disorder)   Relevant Medications   citalopram  (CELEXA ) 40 MG tablet   Other Relevant Orders   ToxASSURE Select 13 (MW), Urine   Hyperlipidemia LDL goal <130   Relevant Medications   atorvastatin  (LIPITOR) 20 MG tablet   enalapril  (VASOTEC ) 5 MG tablet   hydrochlorothiazide  (HYDRODIURIL ) 25 MG tablet   Other Relevant Orders   Lipid panel   Post hysterectomy menopause   Relevant Medications   estradiol  (ESTRACE ) 1 MG tablet   Other Relevant Orders   CBC with Differential/Platelet    Continue with current medication, seems to be doing well, will do blood work on the way out today.  Will also do a urine drug screen today. Follow up plan: Return in about 6 months (around  03/19/2024), or if symptoms worsen or fail to improve, for Physical exam and hypertension and cholesterol.  Counseling provided for all of the vaccine components Orders Placed This Encounter  Procedures   CBC with Differential/Platelet   CMP14+EGFR   Lipid panel   ToxASSURE Select 13 (MW), Urine    Jolyne Needs, MD [facility] 09/17/2023, 10:56 AM

## 2023-09-18 LAB — CMP14+EGFR
ALT: 22 IU/L (ref 0–32)
AST: 23 IU/L (ref 0–40)
Albumin: 4.2 g/dL (ref 3.9–4.9)
Alkaline Phosphatase: 81 IU/L (ref 44–121)
BUN/Creatinine Ratio: 18 (ref 12–28)
BUN: 11 mg/dL (ref 8–27)
Bilirubin Total: 0.6 mg/dL (ref 0.0–1.2)
CO2: 22 mmol/L (ref 20–29)
Calcium: 9.7 mg/dL (ref 8.7–10.3)
Chloride: 103 mmol/L (ref 96–106)
Creatinine, Ser: 0.62 mg/dL (ref 0.57–1.00)
Globulin, Total: 2.1 g/dL (ref 1.5–4.5)
Glucose: 81 mg/dL (ref 70–99)
Potassium: 3.6 mmol/L (ref 3.5–5.2)
Sodium: 142 mmol/L (ref 134–144)
Total Protein: 6.3 g/dL (ref 6.0–8.5)
eGFR: 100 mL/min/{1.73_m2} (ref >59)

## 2023-09-18 LAB — CBC WITH DIFFERENTIAL/PLATELET
Basophils Absolute: 0.1 10*3/uL (ref 0.0–0.2)
Basos: 1 %
EOS (ABSOLUTE): 0.1 10*3/uL (ref 0.0–0.4)
Eos: 2 %
Hematocrit: 46.4 % (ref 34.0–46.6)
Hemoglobin: 15.1 g/dL (ref 11.1–15.9)
Immature Grans (Abs): 0 10*3/uL (ref 0.0–0.1)
Immature Granulocytes: 0 %
Lymphocytes Absolute: 1.5 10*3/uL (ref 0.7–3.1)
Lymphs: 24 %
MCH: 29 pg (ref 26.6–33.0)
MCHC: 32.5 g/dL (ref 31.5–35.7)
MCV: 89 fL (ref 79–97)
Monocytes Absolute: 0.5 10*3/uL (ref 0.1–0.9)
Monocytes: 7 %
Neutrophils Absolute: 4.1 10*3/uL (ref 1.4–7.0)
Neutrophils: 66 %
Platelets: 312 10*3/uL (ref 150–450)
RBC: 5.2 x10E6/uL (ref 3.77–5.28)
RDW: 13.1 % (ref 11.7–15.4)
WBC: 6.2 10*3/uL (ref 3.4–10.8)

## 2023-09-18 LAB — LIPID PANEL
Chol/HDL Ratio: 2.3 ratio (ref 0.0–4.4)
Cholesterol, Total: 186 mg/dL (ref 100–199)
HDL: 82 mg/dL (ref >39)
LDL Chol Calc (NIH): 89 mg/dL (ref 0–99)
Triglycerides: 81 mg/dL (ref 0–149)
VLDL Cholesterol Cal: 15 mg/dL (ref 5–40)

## 2023-09-19 LAB — TOXASSURE SELECT 13 (MW), URINE

## 2023-09-26 ENCOUNTER — Ambulatory Visit: Payer: Self-pay | Admitting: Family Medicine

## 2023-09-27 MED ORDER — ALPRAZOLAM 0.5 MG PO TABS
0.5000 mg | ORAL_TABLET | Freq: Every evening | ORAL | 0 refills | Status: DC | PRN
Start: 2023-09-27 — End: 2024-03-26

## 2023-09-27 NOTE — Addendum Note (Signed)
 Addended by: Jolyne Needs on: 09/27/2023 07:55 AM   Modules accepted: Orders

## 2024-03-26 ENCOUNTER — Encounter: Payer: Self-pay | Admitting: Family Medicine

## 2024-03-26 ENCOUNTER — Ambulatory Visit (INDEPENDENT_AMBULATORY_CARE_PROVIDER_SITE_OTHER): Payer: Self-pay | Admitting: Family Medicine

## 2024-03-26 VITALS — BP 125/73 | HR 63 | Ht 61.0 in | Wt 185.0 lb

## 2024-03-26 DIAGNOSIS — F411 Generalized anxiety disorder: Secondary | ICD-10-CM | POA: Diagnosis not present

## 2024-03-26 DIAGNOSIS — Z0000 Encounter for general adult medical examination without abnormal findings: Secondary | ICD-10-CM

## 2024-03-26 DIAGNOSIS — B001 Herpesviral vesicular dermatitis: Secondary | ICD-10-CM | POA: Diagnosis not present

## 2024-03-26 DIAGNOSIS — F419 Anxiety disorder, unspecified: Secondary | ICD-10-CM

## 2024-03-26 DIAGNOSIS — I10 Essential (primary) hypertension: Secondary | ICD-10-CM

## 2024-03-26 DIAGNOSIS — Z0001 Encounter for general adult medical examination with abnormal findings: Secondary | ICD-10-CM | POA: Diagnosis not present

## 2024-03-26 DIAGNOSIS — E785 Hyperlipidemia, unspecified: Secondary | ICD-10-CM

## 2024-03-26 LAB — LIPID PANEL

## 2024-03-26 MED ORDER — ALPRAZOLAM 0.5 MG PO TABS
0.5000 mg | ORAL_TABLET | Freq: Every evening | ORAL | 1 refills | Status: AC | PRN
Start: 2024-03-26 — End: ?

## 2024-03-26 MED ORDER — VALACYCLOVIR HCL 1 G PO TABS
1000.0000 mg | ORAL_TABLET | Freq: Two times a day (BID) | ORAL | 1 refills | Status: AC
Start: 2024-03-26 — End: ?

## 2024-03-26 NOTE — Progress Notes (Signed)
 BP 125/73   Pulse 63   Ht 5' 1 (1.549 m)   Wt 185 lb (83.9 kg)   SpO2 97%   BMI 34.96 kg/m    Subjective:   Patient ID: Allison [name], female    DOB: Mar 31, 1961, 63 y.o.   MRN: [id number]  HPI: Allison [name] is a 63 y.o. female presenting on 03/26/2024 for Medical Management of Chronic Issues (CPE-no pap) and Hypertension   Discussed the use of AI scribe software for clinical note transcription with the patient, who gave verbal consent to proceed.  History of Present Illness   Allison [name] is a 64 year old female who presents for an annual physical exam and checkup.  Preventive health maintenance - No new health issues or concerns at this time - Performs regular breast self-examinations - Mammogram completed in April 2025 - Colonoscopy performed in 2024 - Has not received influenza vaccination - Considering pneumococcal vaccination  Anxiety and sleep disturbance - Anxiety symptoms well managed with citalopram  - Uses alprazolam  as needed, primarily for difficulty sleeping related to stress - Occasional difficulty initiating sleep due to inability to 'shut her mind down' at night  Hyperlipidemia - On atorvastatin  for cholesterol management - No adverse effects from atorvastatin   Hypertension - On hydrochlorothiazide  and enalapril  for blood pressure control - Blood pressure readings occasionally elevated when measured at [facility], attributed to possible machine error  Menopausal symptoms - Taking estrogen every two days - Experiencing only slight hot flashes          Relevant past medical, surgical, family and social history reviewed and updated as indicated. Interim medical history since our last visit reviewed. Allergies and medications reviewed and updated.  Review of Systems  Constitutional:  Negative for chills and fever.  HENT:  Negative for congestion, ear discharge and ear pain.   Eyes:  Negative for redness and visual disturbance.  Respiratory:  Negative  for chest tightness and shortness of breath.   Cardiovascular:  Negative for chest pain and leg swelling.  Genitourinary:  Negative for difficulty urinating and dysuria.  Skin:  Negative for rash.  Neurological:  Negative for dizziness, light-headedness and headaches.  Psychiatric/Behavioral:  Positive for dysphoric mood. Negative for agitation, behavioral problems, self-injury, sleep disturbance and suicidal ideas. The patient is nervous/anxious.   All other systems reviewed and are negative.   Per HPI unless specifically indicated above   Allergies as of 03/26/2024   No Known Allergies      Medication List        Accurate as of March 26, 2024  8:48 AM. If you have any questions, ask your nurse or doctor.          ALPRAZolam  0.5 MG tablet Commonly known as: XANAX  Take 1 tablet (0.5 mg total) by mouth at bedtime as needed for anxiety.   atorvastatin  20 MG tablet Commonly known as: LIPITOR Take 1 tablet (20 mg total) by mouth daily.   citalopram  40 MG tablet Commonly known as: CELEXA  Take 1 tablet (40 mg total) by mouth daily.   enalapril  5 MG tablet Commonly known as: VASOTEC  Take 1 tablet (5 mg total) by mouth daily.   estradiol  1 MG tablet Commonly known as: ESTRACE  Take 1 tablet (1 mg total) by mouth daily.   hydrochlorothiazide  25 MG tablet Commonly known as: HYDRODIURIL  Take 1 tablet (25 mg total) by mouth daily.   multivitamin with minerals tablet Take 1 tablet by mouth daily.   valACYclovir  1000 MG tablet  Commonly known as: VALTREX  Take 1 tablet (1,000 mg total) by mouth 2 (two) times daily.         Objective:   BP 125/73   Pulse 63   Ht 5' 1 (1.549 m)   Wt 185 lb (83.9 kg)   SpO2 97%   BMI 34.96 kg/m   Wt Readings from Last 3 Encounters:  03/26/24 185 lb (83.9 kg)  09/17/23 182 lb 9.6 oz (82.8 kg)  06/20/23 183 lb (83 kg)    Physical Exam Physical Exam   VITALS: BP- 125/73 NECK: Thyroid normal, no lumps. CHEST: Lungs clear  to auscultation. CARDIOVASCULAR: Heart regular rate and rhythm. ABDOMEN: Abdomen non-tender. EXTREMITIES: No edema, pulses normal. NEUROLOGICAL: Reflexes normal.         Assessment & Plan:   Problem List Items Addressed This Visit       Cardiovascular and Mediastinum   Essential hypertension   Relevant Orders   CBC with Differential/Platelet   CMP14+EGFR   Lipid panel   TSH     Other   GAD (generalized anxiety disorder)   Relevant Medications   ALPRAZolam  (XANAX ) 0.5 MG tablet   Other Relevant Orders   CBC with Differential/Platelet   TSH   Hyperlipidemia LDL goal <130   Relevant Orders   Lipid panel   TSH   Other Visit Diagnoses       Physical exam    -  Primary     Anxiety       Relevant Medications   ALPRAZolam  (XANAX ) 0.5 MG tablet     Cold sore       Relevant Medications   valACYclovir  (VALTREX ) 1000 MG tablet          Adult Wellness Visit Routine wellness visit with normal findings. - Continue routine health maintenance and screenings. - Schedule next wellness visit in six months.  Generalized anxiety disorder Well-managed with citalopram  and alprazolam  as needed for sleep disturbances. - Continue citalopram  as prescribed. - Use alprazolam  as needed for sleep disturbances.  Essential hypertension Well-controlled with current medication regimen. - Continue hydrochlorothiazide  and enalapril  as prescribed. - Monitor blood pressure regularly.  Hyperlipidemia Managed with atorvastatin . Awaiting blood work results. - Continue atorvastatin  as prescribed. - Await blood work results to evaluate lipid levels.  Menopausal symptoms Managed with estrogen therapy. Reports slight hot flashes. - Continue current estrogen therapy regimen.          Follow up plan: Return in about 6 months (around 09/23/2024), or if symptoms worsen or fail to improve, for htn and anxiety.  Counseling provided for all of the vaccine components Orders Placed This  Encounter  Procedures   CBC with Differential/Platelet   CMP14+EGFR   Lipid panel   TSH    Fonda Levins, MD [facility] 03/26/2024, 8:48 AM

## 2024-03-27 ENCOUNTER — Telehealth: Payer: Self-pay | Admitting: Family Medicine

## 2024-03-27 LAB — CMP14+EGFR
ALT: 28 IU/L (ref 0–32)
AST: 29 IU/L (ref 0–40)
Albumin: 4.1 g/dL (ref 3.9–4.9)
Alkaline Phosphatase: 78 IU/L (ref 49–135)
BUN/Creatinine Ratio: 22 (ref 12–28)
BUN: 14 mg/dL (ref 8–27)
Bilirubin Total: 0.7 mg/dL (ref 0.0–1.2)
CO2: 24 mmol/L (ref 20–29)
Calcium: 9.6 mg/dL (ref 8.7–10.3)
Chloride: 101 mmol/L (ref 96–106)
Creatinine, Ser: 0.63 mg/dL (ref 0.57–1.00)
Globulin, Total: 1.9 g/dL (ref 1.5–4.5)
Glucose: 85 mg/dL (ref 70–99)
Potassium: 3.3 mmol/L — AB (ref 3.5–5.2)
Sodium: 142 mmol/L (ref 134–144)
Total Protein: 6 g/dL (ref 6.0–8.5)
eGFR: 100 mL/min/1.73 (ref >59)

## 2024-03-27 LAB — CBC WITH DIFFERENTIAL/PLATELET
Basophils Absolute: 0.1 x10E3/uL (ref 0.0–0.2)
Basos: 1 %
EOS (ABSOLUTE): 0.1 x10E3/uL (ref 0.0–0.4)
Eos: 2 %
Hematocrit: 42.9 % (ref 34.0–46.6)
Hemoglobin: 14.6 g/dL (ref 11.1–15.9)
Immature Grans (Abs): 0 x10E3/uL (ref 0.0–0.1)
Immature Granulocytes: 0 %
Lymphocytes Absolute: 1.4 x10E3/uL (ref 0.7–3.1)
Lymphs: 24 %
MCH: 30.2 pg (ref 26.6–33.0)
MCHC: 34 g/dL (ref 31.5–35.7)
MCV: 89 fL (ref 79–97)
Monocytes Absolute: 0.4 x10E3/uL (ref 0.1–0.9)
Monocytes: 7 %
Neutrophils Absolute: 3.8 x10E3/uL (ref 1.4–7.0)
Neutrophils: 66 %
Platelets: 285 x10E3/uL (ref 150–450)
RBC: 4.83 x10E6/uL (ref 3.77–5.28)
RDW: 13.5 % (ref 11.7–15.4)
WBC: 5.7 x10E3/uL (ref 3.4–10.8)

## 2024-03-27 LAB — TSH: TSH: 2.78 u[IU]/mL (ref 0.450–4.500)

## 2024-03-27 LAB — LIPID PANEL
Cholesterol, Total: 176 mg/dL (ref 100–199)
HDL: 68 mg/dL (ref >39)
LDL CALC COMMENT:: 2.6 ratio (ref 0.0–4.4)
LDL Chol Calc (NIH): 92 mg/dL (ref 0–99)
Triglycerides: 90 mg/dL (ref 0–149)
VLDL Cholesterol Cal: 16 mg/dL (ref 5–40)

## 2024-03-27 NOTE — Telephone Encounter (Signed)
 See lab results   Copied from CRM #[id number]. Topic: Clinical - Medication Question >> Mar 27, 2024  2:25 PM Diannia H wrote: Reason for CRM: Patient is wanting the provider to call in her potassium to [id number] - [place], [place] - [address] [place] [address] Phone: [phone] Fax: [phone] Hours: Not open 24 hours  She would like to pick it up today if possible.

## 2024-03-27 NOTE — Telephone Encounter (Signed)
 I do not have potassium on her current med list, what dose is she taking and how often does she take it and when was the last time she had the levels checked.

## 2024-03-27 NOTE — Telephone Encounter (Signed)
What dose is she taking? 

## 2024-03-27 NOTE — Telephone Encounter (Signed)
 Pt is not sure of the strength of potassium. States that it has always been a prescription. In her med hx has been prescribed.

## 2024-03-27 NOTE — Telephone Encounter (Signed)
 Pt had labs on 11/19. Potassium 3.3.

## 2024-03-28 MED ORDER — POTASSIUM CHLORIDE CRYS ER 20 MEQ PO TBCR: 20.0000 meq | EXTENDED_RELEASE_TABLET | Freq: Every day | ORAL | 3 refills | Status: AC

## 2024-03-28 NOTE — Telephone Encounter (Signed)
 Sent potassium in for her.

## 2024-03-28 NOTE — Addendum Note (Signed)
 Addended by: MARYANNE CHEW on: 03/28/2024 08:46 AM   Modules accepted: Orders

## 2024-04-02 ENCOUNTER — Ambulatory Visit: Payer: Self-pay | Admitting: Family Medicine

## 2024-09-25 ENCOUNTER — Ambulatory Visit: Admitting: Family Medicine
